# Patient Record
Sex: Female | Born: 1980 | Race: White | Hispanic: No | Marital: Married | Smoking: Former smoker
Health system: Southern US, Community
[De-identification: ages and names within clinical notes are randomized; demographics above are authoritative.]

## PROBLEM LIST (undated history)

## (undated) DIAGNOSIS — E669 Obesity, unspecified: Secondary | ICD-10-CM

## (undated) DIAGNOSIS — I493 Ventricular premature depolarization: Secondary | ICD-10-CM

## (undated) DIAGNOSIS — D219 Benign neoplasm of connective and other soft tissue, unspecified: Secondary | ICD-10-CM

## (undated) DIAGNOSIS — J45909 Unspecified asthma, uncomplicated: Secondary | ICD-10-CM

## (undated) DIAGNOSIS — K219 Gastro-esophageal reflux disease without esophagitis: Secondary | ICD-10-CM

## (undated) DIAGNOSIS — S161XXA Strain of muscle, fascia and tendon at neck level, initial encounter: Secondary | ICD-10-CM

## (undated) DIAGNOSIS — D649 Anemia, unspecified: Secondary | ICD-10-CM

## (undated) DIAGNOSIS — R000 Tachycardia, unspecified: Secondary | ICD-10-CM

## (undated) HISTORY — DX: Gastro-esophageal reflux disease without esophagitis: K21.9

## (undated) HISTORY — DX: Obesity, unspecified: E66.9

## (undated) HISTORY — DX: Ventricular premature depolarization: I49.3

## (undated) HISTORY — DX: Tachycardia, unspecified: R00.0

## (undated) HISTORY — DX: Unspecified asthma, uncomplicated: J45.909

## (undated) HISTORY — DX: Benign neoplasm of connective and other soft tissue, unspecified: D21.9

## (undated) HISTORY — DX: Strain of muscle, fascia and tendon at neck level, initial encounter: S16.1XXA

## (undated) HISTORY — PX: DILATION AND CURETTAGE, DIAGNOSTIC / THERAPEUTIC: SUR384

---

## 2001-12-23 ENCOUNTER — Emergency Department (HOSPITAL_COMMUNITY): Admission: EM | Admit: 2001-12-23 | Discharge: 2001-12-23 | Payer: Self-pay | Admitting: Emergency Medicine

## 2001-12-23 ENCOUNTER — Encounter: Payer: Self-pay | Admitting: Emergency Medicine

## 2001-12-29 ENCOUNTER — Emergency Department (HOSPITAL_COMMUNITY): Admission: EM | Admit: 2001-12-29 | Discharge: 2001-12-29 | Payer: Self-pay | Admitting: Emergency Medicine

## 2009-05-20 ENCOUNTER — Encounter: Payer: Self-pay | Admitting: Cardiology

## 2009-05-25 ENCOUNTER — Encounter: Payer: Self-pay | Admitting: Cardiology

## 2009-06-20 ENCOUNTER — Encounter: Payer: Self-pay | Admitting: Cardiology

## 2009-06-22 DIAGNOSIS — R002 Palpitations: Secondary | ICD-10-CM | POA: Insufficient documentation

## 2009-06-23 ENCOUNTER — Ambulatory Visit: Payer: Self-pay | Admitting: Cardiology

## 2009-06-23 DIAGNOSIS — R0602 Shortness of breath: Secondary | ICD-10-CM | POA: Insufficient documentation

## 2009-07-01 ENCOUNTER — Encounter: Payer: Self-pay | Admitting: Cardiology

## 2009-07-01 ENCOUNTER — Ambulatory Visit: Payer: Self-pay | Admitting: Cardiology

## 2009-07-04 ENCOUNTER — Encounter (INDEPENDENT_AMBULATORY_CARE_PROVIDER_SITE_OTHER): Payer: Self-pay | Admitting: *Deleted

## 2009-08-02 ENCOUNTER — Encounter: Payer: Self-pay | Admitting: Cardiology

## 2009-08-03 ENCOUNTER — Ambulatory Visit: Payer: Self-pay | Admitting: Cardiology

## 2010-07-11 NOTE — Procedures (Signed)
Summary: Holter and Event/ CARDIONET ACTIVITY REPORT  Holter and Event/ CARDIONET ACTIVITY REPORT   Imported By: Dorise Hiss 08/03/2009 17:01:43  _____________________________________________________________________  External Attachment:    Type:   Image     Comment:   External Document

## 2010-07-11 NOTE — Letter (Signed)
Summary: Engineer, materials at [facility]  [address]   [place], [place] [address]   Phone: [phone]  Fax: [phone]        July 04, 2009 MRN: [id number]    ANYLA ISRAELSON [address] [place], [place]  [address]    Dear Ms. Hockman,  Your test ordered by Selena Batten has been reviewed by your physician (or physician assistant) and was found to be normal or stable. Your physician (or physician assistant) felt no changes were needed at this time.  __X__ Echocardiogram  ____ Cardiac Stress Test  ____ Lab Work  ____ Peripheral vascular study of arms, legs or neck  ____ CT scan or X-ray  ____ Lung or Breathing test  ____ Other:   Thank you.   Cyril Loosen, RN, BSN    Duane Boston, M.D., F.A.C.C. Thressa Sheller, M.D., F.A.C.C. Oneal Grout, M.D., F.A.C.C. Cheree Ditto, M.D., F.A.C.C. Daiva Nakayama, M.D., F.A.C.C. Kenney Houseman, M.D., F.A.C.C. Jeanne Ivan, PA-C

## 2010-07-11 NOTE — Assessment & Plan Note (Signed)
Summary: 6 wk fu -srs   Visit Type:  Follow-up Primary Provider:  Catalina Lunger Pace/Tapper  CC:  palpitations.  History of Present Illness: The patient presents for followup of palpitations. She is now [redacted] weeks pregnant. She saw Dr. Jens Som recently for these. She had an event monitor which demonstrated occasional premature ventricular contractions, rare ventricular bigeminy and symptomatic sinus tachycardia. There were no sustained dysrhythmias. She did have an echocardiogram which demonstrated a structurally normal heart.  Since that visit she has had fewer symptoms. She has cut out caffeine. She will clearly feel the palpitations. They are short lived and not associated with syncope or presyncope. She's had no new chest discomfort, neck or arm discomfort. She has had no new shortness of breath.  Preventive Screening-Counseling & Management  Alcohol-Tobacco     Smoking Status: quit > 6 months  Comments: smoked as teenager quit about 12 yrs ago  Current Medications (verified): 1)  Pre-Natal Formula  Tabs (Prenatal Multivit-Min-Fe-Fa) .... Take 1 Tablet By Mouth Once A Day  Allergies: 1)  ! Pcn  Comments:  Nurse/Medical Assistant: The patient's medications were reviewed with the patient and were updated in the Medication List. Pt verbally confirmed medications.  Cyril Loosen, RN, BSN (August 03, 2009 11:17 AM)  Past History:  Past Medical History: None  Social History: Smoking Status:  quit > 6 months  Review of Systems       As stated in the HPI and negative for all other systems.   Vital Signs:  Patient profile:   30 year old female Height:      68 inches Weight:      218.75 pounds BMI:     33.38 Pulse rate:   79 / minute BP sitting:   103 / 71  (left arm) Cuff size:   large  Vitals Entered By: Cyril Loosen, RN, BSN (August 03, 2009 11:15 AM)  Nutrition Counseling: Patient's BMI is greater than 25 and therefore counseled on weight management options. CC:  palpitations Comments follow up visit.   Physical Exam  General:  Well developed, well nourished, in no acute distress. Eyes:  PERRLA/EOM intact; conjunctiva and lids normal. Neck:  Neck supple, no JVD. No masses, thyromegaly or abnormal cervical nodes. Chest Wall:  no deformities or breast masses noted Lungs:  Clear bilaterally to auscultation and percussion. Heart:  Non-displaced PMI, chest non-tender; regular rate and rhythm, S1, S2 without murmurs, rubs or gallops. Carotid upstroke normal, no bruit. Normal abdominal aortic size, no bruits. Femorals normal pulses, no bruits. Pedals normal pulses. No edema, no varicosities. Abdomen:  Bowel sounds positive; abdomen soft and non-tender without masses, organomegaly, or hernias noted. No hepatosplenomegaly. Msk:  Back normal, normal gait. Muscle strength and tone normal. Pulses:  pulses normal in all 4 extremities Extremities:  No clubbing or cyanosis. Neurologic:  Alert and oriented x 3. Psych:  Normal affect.   Impression & Recommendations:  Problem # 1:  PALPITATIONS (ICD-785.1) The patient is having fewer palpitations. She's had documented PVCs. At this point these are relatively asymptomatic and after careful discussion with the patient it is agreed that we will not add any medical therapy at this point. However, should these become more symptomatic she will let us know at which point we could consider beta blockers.  Problem # 2:  DYSPNEA (ICD-786.05) She has some mild dyspnea probably related to weight without overt cardiac etiology is identified. This is very mild. No further workup is suggested at this point but we would  reevaluate if this becomes worse.  Patient Instructions: 1)  Your physician recommends that you continue on your current medications as directed. Please refer to the Current Medication list given to you today. 2)  No follow up needed.

## 2010-07-11 NOTE — Letter (Signed)
[facility]: [facility]   Imported By: Zachary George 06/22/2009 15:29:01  _____________________________________________________________________  External Attachment:    Type:   Image     Comment:   External Document

## 2010-07-11 NOTE — Assessment & Plan Note (Signed)
Summary: ***APPT 12:45*** NP- [redacted] WK PREGNANT   Visit Type:  abnorml HR  Primary Provider:  Catalina Lunger Pace/Tapper  CC:  abnormal HR.  History of Present Illness: 30 year old female for evaluation of palpitations. Patient is [redacted] weeks pregnant. The patient has a long history of palpitations described as a skipped. Over the past 8 weeks these have worsened. She feels this is most likely related to pregnancy. They are described as an irregular heartbeat where there is a skip every third beat. She also states that she felt her heart "pounding" but she checked her heart rate and it was in the 70s. She has not had sustained palpitations. She does describe new dyspnea on exertion but there is no orthopnea or PND. There is mild pedal edema which is typical of her previous pregnancies. She has not had exertional chest pain. There's been no syncope. Because of her palpitations cardiology was asked to further evaluate.  Preventive Screening-Counseling & Management  Alcohol-Tobacco     Smoking Status: never     Year Quit: 1999  Current Medications (verified): 1)  Pre-Natal Formula  Tabs (Prenatal Multivit-Min-Fe-Fa) .... Take 1 Tablet By Mouth Once A Day 2)  Zithromax Z-Pak 250 Mg Tabs (Azithromycin) .... Use As Directed  Allergies (verified): 1)  ! Pcn  Comments:  Nurse/Medical Assistant: The patient's medications and allergies were reviewed with the patient and were updated in the Medication and Allergy Lists. Verbally gave names.  Past History:  Past Medical History: no significant past medical history  Past Surgical History: Previous D and C  Family History: Reviewed history from 06/22/2009 and no changes required. Mother with atrial fibrillation Father with DM  Social History: Reviewed history and no changes required. Full Time Married  Tobacco Use - No.  Alcohol Use - no Smoking Status:  never  Review of Systems       Recent bronchitis/sinusitis but no fevers or chills,   hemoptysis, dysphasia, odynophagia, melena, hematochezia, dysuria, hematuria, rash, seizure activity, orthopnea, PND,  claudication. Remaining systems are negative.   Vital Signs:  Patient profile:   30 year old female Height:      68 inches Weight:      214 pounds BMI:     32.66 Pulse rate:   83 / minute BP sitting:   113 / 70  (left arm) Cuff size:   large  Vitals Entered By: Carlye Grippe (June 23, 2009 12:49 PM)  Nutrition Counseling: Patient's BMI is greater than 25 and therefore counseled on weight management options. CC: abnormal HR   Physical Exam  General:  Well developed/well nourished in NAD Skin warm/dry Patient not depressed No peripheral clubbing Back-normal HEENT-normal/normal eyelids Neck supple/normal carotid upstroke bilaterally; no bruits; no JVD; no thyromegaly chest - CTA/ normal expansion CV - RRR/normal S1 and S2; 2/6 systolic ejection murmur, but no rubs or gallops.  PMI nondisplaced Abdomen -NT/ND, no HSM, 9 week intrauterine pregnancy + bowel sounds, no bruit 2+ femoral pulses, no bruits Ext-trace edema, chords, 2+ DP, varicosities noted Neuro-grossly nonfocal     EKG  Procedure date:  06/23/2009  Findings:      Sinus rhythm at a rate of 76. Axis normal. No significant ST changes.  Impression & Recommendations:  Problem # 1:  PALPITATIONS (ICD-785.1) These are most likely secondary to PACs or PVCs. However she is very concerned about these. I will schedule a CardioNet monitor for more full evaluation. If these are indeed premature beats I would like to avoid beta-blockade if possible  given that she is pregnant. We will reserve this for  significant symptoms. Orders: EKG w/ Interpretation (93000) Cardionet/Event Monitor (Cardionet/Event) 2-D Echocardiogram (2D Echo) 2-D Echocardiogram (2D Echo) Cardionet/Event Monitor (Cardionet/Event)  Problem # 2:  DYSPNEA (ICD-786.05) Check echocardiogram for LV function.  Problem # 3:   INTRAUTERINE PREGNANCY (ICD-V22.2) Management per OB.  Patient Instructions: 1)  Your physician has requested that you have an echocardiogram.  Echocardiography is a painless test that uses sound waves to create images of your heart. It provides your doctor with information about the size and shape of your heart and how well your heart's chambers and valves are working.  This procedure takes approximately one hour. There are no restrictions for this procedure. 2)  Your physician has recommended that you wear an event monitor.  Event monitors are medical devices that record the heart's electrical activity. Doctors most often use these monitors to diagnose arrhythmias. Arrhythmias are problems with the speed or rhythm of the heartbeat. The monitor is a small, portable device. You can wear one while you do your normal daily activities. This is usually used to diagnose what is causing palpitations/syncope (passing out). 3)  Your physician recommends that you schedule a follow-up appointment in: 6 weeks.

## 2010-12-14 ENCOUNTER — Encounter: Payer: Self-pay | Admitting: Cardiology

## 2012-06-11 HISTORY — PX: TRANSTHORACIC ECHOCARDIOGRAM: SHX275

## 2012-06-24 ENCOUNTER — Ambulatory Visit (HOSPITAL_COMMUNITY)
Admission: RE | Admit: 2012-06-24 | Discharge: 2012-06-24 | Disposition: A | Discharge status: Home / Self Care | Payer: BC Managed Care – PPO | Source: Ambulatory Visit | Attending: Cardiovascular Disease | Admitting: Cardiovascular Disease

## 2012-06-24 DIAGNOSIS — R0602 Shortness of breath: Secondary | ICD-10-CM | POA: Insufficient documentation

## 2012-06-24 NOTE — Progress Notes (Signed)
*  PRELIMINARY RESULTS* Echocardiogram 2D Echocardiogram has been performed.  Deborah Spence 06/24/2012, 9:22 AM

## 2013-01-16 ENCOUNTER — Telehealth: Payer: Self-pay | Admitting: *Deleted

## 2013-01-16 ENCOUNTER — Other Ambulatory Visit: Payer: Self-pay | Admitting: Cardiovascular Disease

## 2013-01-16 DIAGNOSIS — D649 Anemia, unspecified: Secondary | ICD-10-CM

## 2013-01-16 NOTE — Telephone Encounter (Signed)
Pt. Seen in [place] today , GI referral entered with Dr. Darrick Penna in [place]

## 2013-01-17 LAB — CBC WITH DIFFERENTIAL/PLATELET
Basophils Absolute: 0 10*3/uL (ref 0.0–0.1)
Basophils Relative: 0 % (ref 0–1)
Eosinophils Absolute: 0.2 10*3/uL (ref 0.0–0.7)
Eosinophils Relative: 3 % (ref 0–5)
HCT: 41 % (ref 36.0–46.0)
Hemoglobin: 13.8 g/dL (ref 12.0–15.0)
Lymphocytes Relative: 25 % (ref 12–46)
Lymphs Abs: 2.2 10*3/uL (ref 0.7–4.0)
MCH: 29.2 pg (ref 26.0–34.0)
MCHC: 33.7 g/dL (ref 30.0–36.0)
MCV: 86.7 fL (ref 78.0–100.0)
Monocytes Absolute: 0.6 10*3/uL (ref 0.1–1.0)
Monocytes Relative: 6 % (ref 3–12)
Neutro Abs: 5.9 10*3/uL (ref 1.7–7.7)
Neutrophils Relative %: 66 % (ref 43–77)
Platelets: 338 10*3/uL (ref 150–400)
RBC: 4.73 MIL/uL (ref 3.87–5.11)
RDW: 14 % (ref 11.5–15.5)
WBC: 8.9 10*3/uL (ref 4.0–10.5)

## 2013-01-17 LAB — IRON AND TIBC
%SAT: 22 % (ref 20–55)
Iron: 73 ug/dL (ref 42–145)
TIBC: 339 ug/dL (ref 250–470)
UIBC: 266 ug/dL (ref 125–400)

## 2013-01-17 LAB — COMPREHENSIVE METABOLIC PANEL
Alkaline Phosphatase: 67 U/L (ref 39–117)
CO2: 29 mEq/L (ref 19–32)
Creat: 0.73 mg/dL (ref 0.50–1.10)
Glucose, Bld: 78 mg/dL (ref 70–99)
Total Bilirubin: 0.4 mg/dL (ref 0.3–1.2)

## 2013-01-17 LAB — RETICULOCYTES
ABS Retic: 85.1 10*3/uL (ref 19.0–186.0)
Retic Ct Pct: 1.8 % (ref 0.4–2.3)

## 2013-01-17 LAB — FERRITIN: Ferritin: 6 ng/mL — ABNORMAL LOW (ref 10–291)

## 2013-01-17 LAB — FOLATE: Folate: >20 ng/mL

## 2013-01-21 ENCOUNTER — Encounter: Payer: Self-pay | Admitting: Cardiovascular Disease

## 2013-02-27 ENCOUNTER — Encounter: Payer: Self-pay | Admitting: Gastroenterology

## 2013-03-23 ENCOUNTER — Ambulatory Visit: Payer: BC Managed Care – PPO | Admitting: Gastroenterology

## 2013-07-06 ENCOUNTER — Ambulatory Visit: Payer: BC Managed Care – PPO | Admitting: Gastroenterology

## 2013-11-27 ENCOUNTER — Encounter: Payer: Self-pay | Admitting: *Deleted

## 2013-12-08 ENCOUNTER — Ambulatory Visit: Payer: BC Managed Care – PPO | Admitting: Internal Medicine

## 2013-12-22 ENCOUNTER — Encounter (HOSPITAL_COMMUNITY): Payer: Self-pay | Admitting: Emergency Medicine

## 2013-12-22 ENCOUNTER — Emergency Department (HOSPITAL_COMMUNITY)
Admission: EM | Admit: 2013-12-22 | Discharge: 2013-12-22 | Disposition: A | Discharge status: Home / Self Care | Payer: BC Managed Care – PPO | Attending: Emergency Medicine | Admitting: Emergency Medicine

## 2013-12-22 DIAGNOSIS — Z880 Allergy status to penicillin: Secondary | ICD-10-CM | POA: Insufficient documentation

## 2013-12-22 DIAGNOSIS — Z79899 Other long term (current) drug therapy: Secondary | ICD-10-CM | POA: Insufficient documentation

## 2013-12-22 DIAGNOSIS — Z87891 Personal history of nicotine dependence: Secondary | ICD-10-CM | POA: Insufficient documentation

## 2013-12-22 DIAGNOSIS — M79605 Pain in left leg: Secondary | ICD-10-CM

## 2013-12-22 DIAGNOSIS — M79609 Pain in unspecified limb: Secondary | ICD-10-CM | POA: Insufficient documentation

## 2013-12-22 NOTE — Discharge Instructions (Signed)
Please return to the emergency department tomorrow to have an ultrasound of your left leg to rule out a blood clot. If you have worsening pain, chest pain or shortness of breath before your appointment, please return to the emergency department.   Possible Deep Vein Thrombosis A deep vein thrombosis (DVT) is a blood clot that develops in the deep, larger veins of the leg, arm, or pelvis. These are more dangerous than clots that might form in veins near the surface of the body. A DVT can lead to complications if the clot breaks off and travels in the bloodstream to the lungs.  A DVT can damage the valves in your leg veins, so that instead of flowing upward, the blood pools in the lower leg. This is called post-thrombotic syndrome, and it can result in pain, swelling, discoloration, and sores on the leg. CAUSES Usually, several things contribute to blood clots forming. Contributing factors include:  The flow of blood slows down.  The inside of the vein is damaged in some way.  You have a condition that makes blood clot more easily. RISK FACTORS Some people are more likely than others to develop blood clots. Risk factors include:   Older age, especially over 1 years of age.  Having a family history of blood clots or if you have already had a blot clot.  Having major or lengthy surgery. This is especially true for surgery on the hip, knee, or belly (abdomen). Hip surgery is particularly high risk.  Breaking a hip or leg.  Sitting or lying still for a long time. This includes long-distance travel, paralysis, or recovery from an illness or surgery.  Having cancer or cancer treatment.  Having a long, thin tube (catheter) placed inside a vein during a medical procedure.  Being overweight (obese).  Pregnancy and childbirth.  Hormone changes make the blood clot more easily during pregnancy.  The fetus puts pressure on the veins of the pelvis.  There is a risk of injury to veins during  delivery or a caesarean. The risk is highest just after childbirth.  Medicines with the female hormone estrogen. This includes birth control pills and hormone replacement therapy.  Smoking.  Other circulation or heart problems.  SIGNS AND SYMPTOMS When a clot forms, it can either partially or totally block the blood flow in that vein. Symptoms of a DVT can include:  Swelling of the leg or arm, especially if one side is much worse.  Warmth and redness of the leg or arm, especially if one side is much worse.  Pain in an arm or leg. If the clot is in the leg, symptoms may be more noticeable or worse when standing or walking. The symptoms of a DVT that has traveled to the lungs (pulmonary embolism, PE) usually start suddenly and include:  Shortness of breath.  Coughing.  Coughing up blood or blood-tinged phlegm.  Chest pain. The chest pain is often worse with deep breaths.  Rapid heartbeat. Anyone with these symptoms should get emergency medical treatment right away. Call your local emergency services (911 in the [place] if you have these symptoms. DIAGNOSIS If a DVT is suspected, your health care provider will take a full medical history and perform a physical exam. Tests that also may be required include:  Blood tests, including studies of the clotting properties of the blood.  Ultrasonography to see if you have clots in your legs or lungs.  X-rays to show the flow of blood when dye is injected into  the veins (venography).  Studies of your lungs if you have any chest symptoms. PREVENTION  Exercise the legs regularly. Take a brisk 30-minute walk every day.  Maintain a weight that is appropriate for your height.  Avoid sitting or lying in bed for long periods of time without moving your legs.  Women, particularly those over the age of 40 years, should consider the risks and benefits of taking estrogen medicines, including birth control pills.  Do not smoke, especially if you  take estrogen medicines.  Long-distance travel can increase your risk of DVT. You should exercise your legs by walking or pumping the muscles every hour.  In-hospital prevention:  Many of the risk factors above relate to situations that exist with hospitalization, either for illness, injury, or elective surgery.  Your health care provider will assess you for the need for venous thromboembolism prophylaxis when you are admitted to the hospital. If you are having surgery, your surgeon will assess you the day of or day after surgery.  Prevention may include medical and nonmedical measures. TREATMENT Once identified, a DVT can be treated. It can also be prevented in some circumstances. Once you have had a DVT, you may be at increased risk for a DVT in the future. The most common treatment for DVT is blood thinning (anticoagulant) medicine, which reduces the blood's tendency to clot. Anticoagulants can stop new blood clots from forming and stop old ones from growing. They cannot dissolve existing clots. Your body does this by itself over time. Anticoagulants can be given by mouth, by IV access, or by injection. Your health care provider will determine the best program for you. Other medicines or treatments that may be used are:  Heparin or related medicines (low molecular weight heparin) are usually the first treatment for a blood clot. They act quickly. However, they cannot be taken orally.  Heparin can cause a fall in a component of blood that stops bleeding and forms blood clots (platelets). You will be monitored with blood tests to be sure this does not occur.  Warfarin is an anticoagulant that can be swallowed. It takes a few days to start working, so usually heparin or related medicines are used in combination. Once warfarin is working, heparin is usually stopped.  Less commonly, clot dissolving drugs (thrombolytics) are used to dissolve a DVT. They carry a high risk of bleeding, so they are  used mainly in severe cases, where your life or a limb is threatened.  Very rarely, a blood clot in the leg needs to be removed surgically.  If you are unable to take anticoagulants, your health care provider may arrange for you to have a filter placed in a main vein in your abdomen. This filter prevents clots from traveling to your lungs. HOME CARE INSTRUCTIONS  Take all medicines prescribed by your health care provider. Only take over-the-counter or prescription medicines for pain, fever, or discomfort as directed by your health care provider.  Warfarin. Most people will continue taking warfarin after hospital discharge. Your health care provider will advise you on the length of treatment (usually 3-6 months, sometimes lifelong).  Too much and too little warfarin are both dangerous. Too much warfarin increases the risk of bleeding. Too little warfarin continues to allow the risk for blood clots. While taking warfarin, you will need to have regular blood tests to measure your blood clotting time. These blood tests usually include both the prothrombin time (PT) and international normalized ratio (INR) tests. The PT and INR  results allow your health care provider to adjust your dose of warfarin. The dose can change for many reasons. It is critically important that you take warfarin exactly as prescribed, and that you have your PT and INR levels drawn exactly as directed.  Many foods, especially foods high in vitamin K, can interfere with warfarin and affect the PT and INR results. Foods high in vitamin K include spinach, kale, broccoli, cabbage, collard and turnip greens, brussel sprouts, peas, cauliflower, seaweed, and parsley as well as beef and pork liver, green tea, and soybean oil. You should eat a consistent amount of foods high in vitamin K. Avoid major changes in your diet, or notify your health care provider before changing your diet. Arrange a visit with a dietitian to answer your  questions.  Many medicines can interfere with warfarin and affect the PT and INR results. You must tell your health care provider about any and all medicines you take. This includes all vitamins and supplements. Be especially cautious with aspirin and anti-inflammatory medicines. Ask your health care provider before taking these. Do not take or discontinue any prescribed or over-the-counter medicine except on the advice of your health care provider or pharmacist.  Warfarin can have side effects, primarily excessive bruising or bleeding. You will need to hold pressure over cuts for longer than usual. Your health care provider or pharmacist will discuss other potential side effects.  Alcohol can change the body's ability to handle warfarin. It is best to avoid alcoholic drinks or consume only very small amounts while taking warfarin. Notify your health care provider if you change your alcohol intake.  Notify your dentist or other health care providers before procedures.  Activity. Ask your health care provider how soon you can go back to normal activities. It is important to stay active to prevent blood clots. If you are on anticoagulant medicine, avoid contact sports.  Exercise. It is very important to exercise. This is especially important while traveling, sitting, or standing for long periods of time. Exercise your legs by walking or by pumping the muscles frequently. Take frequent walks.  Compression stockings. These are tight elastic stockings that apply pressure to the lower legs. This pressure can help keep the blood in the legs from clotting. You may need to wear compression stockings at home to help prevent a DVT.  Do not smoke. If you smoke, quit. Ask your health care provider for help with quitting smoking.  Learn as much as you can about DVT. Knowing more about the condition should help you keep it from coming back.  Wear a medical alert bracelet or carry a medical alert card. SEEK  MEDICAL CARE IF:  You notice a rapid heartbeat.  You feel weaker or more tired than usual.  You feel faint.  You notice increased bruising.  You feel your symptoms are not getting better in the time expected.  You believe you are having side effects of medicine. SEEK IMMEDIATE MEDICAL CARE IF:  You have chest pain.  You have trouble breathing.  You have new or increased swelling or pain in one leg.  You cough up blood.  You notice blood in vomit, in a bowel movement, or in urine. MAKE SURE YOU:  Understand these instructions.  Will watch your condition.  Will get help right away if you are not doing well or get worse. Document Released: 05/28/2005 Document Revised: 03/18/2013 Document Reviewed: 02/02/2013 [facility] Patient Information 2015 [place], [place]. This information is not intended to replace advice  given to you by your health care provider. Make sure you discuss any questions you have with your health care provider.

## 2013-12-22 NOTE — ED Notes (Signed)
Dr. Ward at bedside.

## 2013-12-22 NOTE — ED Provider Notes (Signed)
TIME SEEN: 6:22 PM  CHIEF COMPLAINT: Left lower extremity pain and swelling  HPI: Patient is a 33 year old female with no significant past medical history who presents emergency department with pain, bruising and swelling in her left lower extremity behind her knee. She states she noticed this yesterday when she was sitting in a rocking chair. Denies any known injury. She states that she noticed a bruise to this area this morning and increased swelling in this leg. She was worried that she could have a DVT. No prior history of PE or DVT. No chest pain or shortness of breath. No fever. No joint swelling. She is having intermittent tingling sensation in her leg but no numbness or focal weakness. No bowel or bladder incontinence. No back pain.  ROS: See HPI Constitutional: no fever  Eyes: no drainage  ENT: no runny nose   Cardiovascular:  no chest pain  Resp: no SOB  GI: no vomiting GU: no dysuria Integumentary: no rash  Allergy: no hives  Musculoskeletal: no leg swelling  Neurological: no slurred speech ROS otherwise negative  PAST MEDICAL HISTORY/PAST SURGICAL HISTORY:  Past Medical History  Diagnosis Date  . Intermittent palpitations     MEDICATIONS:  Prior to Admission medications   Medication Sig Start Date End Date Taking? Authorizing Provider  Acetaminophen (TYLENOL PO) Take by mouth as needed.    Historical Provider, MD  ferrous sulfate 325 (65 FE) MG tablet Take 325 mg by mouth daily with breakfast.    Historical Provider, MD  Prenatal Multivit-Min-Fe-FA (PRE-NATAL FORMULA) TABS Take 1 tablet by mouth daily.      Historical Provider, MD  ranitidine (ZANTAC) 150 MG capsule Take 150 mg by mouth 2 (two) times daily.    Historical Provider, MD  vitamin C (ASCORBIC ACID) 500 MG tablet Take 500 mg by mouth daily.    Historical Provider, MD    ALLERGIES:  Allergies  Allergen Reactions  . Penicillins     SOCIAL HISTORY:  History  Substance Use Topics  . Smoking status:  Former Smoker -- 0.50 packs/day for 5 years    Types: Cigarettes    Quit date: 11/27/1997  . Smokeless tobacco: Not on file     Comment: Tobacco use-no  . Alcohol Use: No    FAMILY HISTORY: Family History  Problem Relation Age of Onset  . Atrial fibrillation Mother     SSS, AF ablation  . Diabetes Father   . Lung cancer Maternal Grandfather   . Cancer Paternal Grandmother     EXAM: BP 137/51  Pulse 77  Temp(Src) 97.5 F (36.4 C) (Oral)  Resp 18  Ht 5\' 8"  (1.727 m)  Wt 280 lb (127.007 kg)  BMI 42.58 kg/m2  SpO2 100%  LMP 12/19/2013 CONSTITUTIONAL: Alert and oriented and responds appropriately to questions. Well-appearing; well-nourished, pleasant, smiling, in no apparent distress HEAD: Normocephalic EYES: Conjunctivae clear, PERRL ENT: normal nose; no rhinorrhea; moist mucous membranes; pharynx without lesions noted NECK: Supple, no meningismus, no LAD  CARD: RRR; S1 and S2 appreciated; no murmurs, no clicks, no rubs, no gallops RESP: Normal chest excursion without splinting or tachypnea; breath sounds clear and equal bilaterally; no wheezes, no rhonchi, no rales,  ABD/GI: Normal bowel sounds; non-distended; soft, non-tender, no rebound, no guarding BACK:  The back appears normal and is non-tender to palpation, there is no CVA tenderness EXT: Tender to palpation in the posterior left knee with a bruise to the proximal posterior lateral calf, compartments are soft, small amount of increased  swelling in the left leg is apparent compared to the right, 2+ DP pulses bilaterally, sensation to light touch intact diffusely, no joint effusion, no bony tenderness or deformity, Normal ROM in all joints; otherwise extremities are non-tender to palpation; no edema; normal capillary refill; no cyanosis    SKIN: Normal color for age and race; warm NEURO: Moves all extremities equally, sensation to light touch intact diffusely PSYCH: The patient's mood and manner are appropriate. Grooming and  personal hygiene are appropriate.  MEDICAL DECISION MAKING: Patient here with likely ruptured varicose vein, superficial thrombophlebitis but DVT cannot be excluded. Given she has a large bruise to this area for her d-dimer will likely be positive. I feel she will need an ultrasound of her lower extremity that we are unable to obtain this at this time given we have no ultrasound technician. Given this not an emergent condition, have offered patient's Lovenox with followup tomorrow for an ultrasound. She refuses anticoagulation at this time stating she has heavy menstrual periods and has been anemic and it makes her very concerned to take anticoagulation. She understands that there are risks including pulmonary embolus which can be life-threatening without treatment if this is a possible DVT. She agrees to come back tomorrow morning for a venous Doppler of her lower extremity. There is no sign of cellulitis, septic arthritis and no history of any trauma to suggest bony injury. I do feel she is safe to be discharged home. She denies wanting any pain medication at this time. We'll have her alternate Tylenol and Motrin, rest, elevate and apply ice to this area. Have discussed strict return precautions. Patient verbalizes understanding and is comfortable with plan.      [place], DO 12/22/13 [phone]

## 2013-12-22 NOTE — ED Notes (Signed)
Pt states a vein burst in the back of her left leg last night. States her leg is pai ful and swollen

## 2013-12-23 ENCOUNTER — Ambulatory Visit (HOSPITAL_COMMUNITY)
Admission: RE | Admit: 2013-12-23 | Discharge: 2013-12-23 | Disposition: A | Discharge status: Home / Self Care | Payer: BC Managed Care – PPO | Source: Ambulatory Visit | Attending: Emergency Medicine | Admitting: Emergency Medicine

## 2013-12-23 DIAGNOSIS — M7989 Other specified soft tissue disorders: Secondary | ICD-10-CM | POA: Insufficient documentation

## 2013-12-23 DIAGNOSIS — R002 Palpitations: Secondary | ICD-10-CM | POA: Insufficient documentation

## 2013-12-30 ENCOUNTER — Ambulatory Visit (HOSPITAL_COMMUNITY): Payer: BC Managed Care – PPO

## 2014-01-09 DIAGNOSIS — S161XXA Strain of muscle, fascia and tendon at neck level, initial encounter: Secondary | ICD-10-CM

## 2014-01-09 HISTORY — DX: Strain of muscle, fascia and tendon at neck level, initial encounter: S16.1XXA

## 2014-01-25 ENCOUNTER — Emergency Department (HOSPITAL_COMMUNITY)
Admission: EM | Admit: 2014-01-25 | Discharge: 2014-01-25 | Disposition: A | Discharge status: Home / Self Care | Payer: BC Managed Care – PPO | Attending: Emergency Medicine | Admitting: Emergency Medicine

## 2014-01-25 ENCOUNTER — Encounter (HOSPITAL_COMMUNITY): Payer: Self-pay | Admitting: Emergency Medicine

## 2014-01-25 ENCOUNTER — Emergency Department (HOSPITAL_COMMUNITY): Payer: BC Managed Care – PPO

## 2014-01-25 DIAGNOSIS — S161XXA Strain of muscle, fascia and tendon at neck level, initial encounter: Secondary | ICD-10-CM

## 2014-01-25 DIAGNOSIS — S4980XA Other specified injuries of shoulder and upper arm, unspecified arm, initial encounter: Secondary | ICD-10-CM | POA: Insufficient documentation

## 2014-01-25 DIAGNOSIS — S46909A Unspecified injury of unspecified muscle, fascia and tendon at shoulder and upper arm level, unspecified arm, initial encounter: Secondary | ICD-10-CM | POA: Diagnosis not present

## 2014-01-25 DIAGNOSIS — Z880 Allergy status to penicillin: Secondary | ICD-10-CM | POA: Diagnosis not present

## 2014-01-25 DIAGNOSIS — Z79899 Other long term (current) drug therapy: Secondary | ICD-10-CM | POA: Insufficient documentation

## 2014-01-25 DIAGNOSIS — D649 Anemia, unspecified: Secondary | ICD-10-CM | POA: Insufficient documentation

## 2014-01-25 DIAGNOSIS — S139XXA Sprain of joints and ligaments of unspecified parts of neck, initial encounter: Secondary | ICD-10-CM | POA: Insufficient documentation

## 2014-01-25 DIAGNOSIS — S0993XA Unspecified injury of face, initial encounter: Secondary | ICD-10-CM | POA: Diagnosis present

## 2014-01-25 DIAGNOSIS — S199XXA Unspecified injury of neck, initial encounter: Secondary | ICD-10-CM

## 2014-01-25 DIAGNOSIS — Y9389 Activity, other specified: Secondary | ICD-10-CM | POA: Diagnosis not present

## 2014-01-25 DIAGNOSIS — Y92410 Unspecified street and highway as the place of occurrence of the external cause: Secondary | ICD-10-CM | POA: Diagnosis not present

## 2014-01-25 DIAGNOSIS — Z87891 Personal history of nicotine dependence: Secondary | ICD-10-CM | POA: Insufficient documentation

## 2014-01-25 HISTORY — DX: Anemia, unspecified: D64.9

## 2014-01-25 MED ORDER — METAXALONE 800 MG PO TABS: 800.0000 mg | ORAL_TABLET | Freq: Three times a day (TID) | ORAL | Status: DC
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
## 2014-01-25 NOTE — Discharge Instructions (Signed)
Cervical Sprain A cervical sprain is when the tissues (ligaments) that hold the neck bones in place stretch or tear. HOME CARE   Put ice on the injured area.  Put ice in a plastic bag.  Place a towel between your skin and the bag.  Leave the ice on for 15-20 minutes, 3-4 times a day.  Only take medicine as told by your doctor.  Adjust your work station so that you have good posture while you work.  Avoid positions and activities that make your problems worse.  Warm up and stretch before being active.  Apply a heating pad to your neck and shoulder area for 20 minutes 3 times daily starting on day 3.  This will help relax muscle spasm. GET HELP IF:  Your pain is not controlled with medicine.  You cannot take less pain medicine over time as planned.  Your activity level does not improve as expected. GET HELP RIGHT AWAY IF:   You are bleeding.  Your stomach is upset.  You have an allergic reaction to your medicine.  You develop new problems that you cannot explain.  You lose feeling (become numb) or you cannot move any part of your body (paralysis).  You have tingling or weakness in any part of your body.  Your symptoms get worse. Symptoms include:  Pain, soreness, stiffness, puffiness (swelling), or a burning feeling in your neck.  Pain when your neck is touched.  Shoulder or upper back pain.  Limited ability to move your neck.  Headache.  Dizziness.  Your hands or arms feel week, lose feeling, or tingle.  Muscle spasms.  Difficulty swallowing or chewing. MAKE SURE YOU:   Understand these instructions.  Will watch your condition.  Will get help right away if you are not doing well or get worse. Document Released: 11/14/2007 Document Revised: 01/28/2013 Document Reviewed: 12/03/2012 [facility] Patient Information 2015 [place], [place]. This information is not intended to replace advice given to you by your health care provider. Make sure you discuss any  questions you have with your health care provider.
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
## 2014-01-26 NOTE — ED Provider Notes (Signed)
Medical screening examination/treatment/procedure(s) were performed by non-physician practitioner and as supervising physician I was immediately available for consultation/collaboration.   EKG Interpretation None        Delice Bison Aquarius Latouche, DO 01/26/14 1636

## 2014-02-22 ENCOUNTER — Ambulatory Visit (INDEPENDENT_AMBULATORY_CARE_PROVIDER_SITE_OTHER): Payer: BC Managed Care – PPO | Admitting: Internal Medicine

## 2014-02-22 ENCOUNTER — Encounter: Payer: Self-pay | Admitting: Internal Medicine

## 2014-02-22 VITALS — BP 126/78 | HR 62 | Ht 64.0 in | Wt 232.0 lb

## 2014-02-22 DIAGNOSIS — I493 Ventricular premature depolarization: Secondary | ICD-10-CM | POA: Insufficient documentation

## 2014-02-22 DIAGNOSIS — R002 Palpitations: Secondary | ICD-10-CM

## 2014-02-22 DIAGNOSIS — R0602 Shortness of breath: Secondary | ICD-10-CM

## 2014-02-22 DIAGNOSIS — I4949 Other premature depolarization: Secondary | ICD-10-CM
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
## 2014-02-22 NOTE — Patient Instructions (Signed)
Your physician wants you to follow-up in: 1 year with Dr. Rayann Heman. You will receive a reminder letter in the mail two months in advance. If you don't receive a letter, please call our office to schedule the follow-up appointment.  Your physician recommends that you continue on your current medications as directed. Please refer to the Current Medication list given to you today.  Your physician has requested that you have an exercise tolerance test. For further information please visit [email or web address]. Please also follow instruction sheet, as given.

## 2014-04-09 ENCOUNTER — Encounter: Payer: BC Managed Care – PPO | Admitting: Physician Assistant

## 2014-04-09 ENCOUNTER — Ambulatory Visit: Payer: BC Managed Care – PPO | Admitting: Physician Assistant

## 2014-05-14 ENCOUNTER — Encounter: Payer: Self-pay | Admitting: Internal Medicine

## 2014-05-20 ENCOUNTER — Telehealth: Payer: Self-pay | Admitting: General Practice

## 2014-05-20 NOTE — Telephone Encounter (Signed)
New message       Patient is calling to schedule a nuclear stress test on tomorrow 05/21/14  Please advice.

## 2014-05-20 NOTE — Telephone Encounter (Signed)
This patient already has an appt for tomorrow, Deborah Spence called to confirm, is she just confirming the appt or does she need to reschedule? The schedulers can reschedule this if that is what she needs.

## 2014-05-21 ENCOUNTER — Ambulatory Visit (INDEPENDENT_AMBULATORY_CARE_PROVIDER_SITE_OTHER): Payer: BC Managed Care – PPO | Admitting: Nurse Practitioner

## 2014-05-21 DIAGNOSIS — R002 Palpitations: Secondary | ICD-10-CM

## 2014-05-21 DIAGNOSIS — R0602 Shortness of breath: Secondary | ICD-10-CM

## 2014-05-21 NOTE — Progress Notes (Signed)
Exercise Treadmill Test  Pre-Exercise Testing Evaluation Rhythm: normal sinus  Rate: 81 bpm     Test  Exercise Tolerance Test Ordering MD: Thompson Grayer, MD  Interpreting MD: Ignacia Bayley, NP  Unique Test No: 1  Treadmill:  1  Indication for ETT: exertional dyspnea  Contraindication to ETT: No   Stress Modality: exercise - treadmill  Cardiac Imaging Performed: non   Protocol: standard Bruce - maximal  Max BP:  177/65  Max MPHR (bpm):  185 85% MPR (bpm):  157  MPHR obtained (bpm):  171 % MPHR obtained:  92  Reached 85% MPHR (min:sec):  4:40 Total Exercise Time (min-sec):  9:00  Workload in METS:  10.2 Borg Scale: 13  Reason ETT Terminated:  fatigue    ST Segment Analysis At Rest: normal ST segments - no evidence of significant ST depression With Exercise: no evidence of significant ST depression  Other Information Arrhythmia:  pac's Angina during ETT:  absent (0) Quality of ETT:  diagnostic  ETT Interpretation:  normal - no evidence of ischemia by ST analysis  Comments: No chest pain, palpitations, or pvc's.  No acute st/t changes.  Recommendations: No further ischemic w/u required.  F/U with Dr. Rayann Heman prn.

## 2015-04-12 ENCOUNTER — Encounter: Payer: Self-pay | Admitting: Physician Assistant

## 2015-04-12 DIAGNOSIS — E669 Obesity, unspecified: Secondary | ICD-10-CM | POA: Insufficient documentation
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
## 2015-04-13 ENCOUNTER — Encounter: Payer: Self-pay | Admitting: Physician Assistant

## 2015-04-13 ENCOUNTER — Ambulatory Visit (INDEPENDENT_AMBULATORY_CARE_PROVIDER_SITE_OTHER): Payer: BC Managed Care – PPO | Admitting: Physician Assistant

## 2015-04-13 VITALS — BP 110/60 | HR 73 | Ht 64.0 in | Wt 238.0 lb

## 2015-04-13 DIAGNOSIS — I493 Ventricular premature depolarization: Secondary | ICD-10-CM

## 2015-04-13 DIAGNOSIS — D649 Anemia, unspecified: Secondary | ICD-10-CM | POA: Diagnosis not present

## 2015-04-13 DIAGNOSIS — R079 Chest pain, unspecified: Secondary | ICD-10-CM | POA: Diagnosis not present

## 2015-04-13 DIAGNOSIS — E669 Obesity, unspecified: Secondary | ICD-10-CM | POA: Diagnosis not present

## 2015-04-13 NOTE — Patient Instructions (Signed)
Medication Instructions:  Your physician recommends that you continue on your current medications as directed. Please refer to the Current Medication list given to you today.  Labwork: NONE  Testing/Procedures: Your physician has requested that you have an exercise tolerance test ASAP. For further information please visit [email or web address]. Please also follow instruction sheet, as given.  Follow-Up: Your physician wants you to follow-up in: 1 year with Dr. Rayann Heman. You will receive a reminder letter in the mail two months in advance. If you don't receive a letter, please call our office to schedule the follow-up appointment.    If you need a refill on your cardiac medications before your next appointment, please call your pharmacy.

## 2015-04-15 ENCOUNTER — Ambulatory Visit (HOSPITAL_COMMUNITY)
Admission: RE | Admit: 2015-04-15 | Discharge: 2015-04-15 | Disposition: A | Discharge status: Home / Self Care | Payer: BC Managed Care – PPO | Source: Ambulatory Visit | Attending: Physician Assistant | Admitting: Physician Assistant

## 2015-04-15 DIAGNOSIS — I493 Ventricular premature depolarization: Secondary | ICD-10-CM

## 2015-04-15 DIAGNOSIS — R079 Chest pain, unspecified: Secondary | ICD-10-CM | POA: Diagnosis not present

## 2015-05-03 LAB — EXERCISE TOLERANCE TEST
CHL CUP RESTING HR STRESS: 95 {beats}/min
CSEPEW: 10.4 METS
Exercise duration (min): 8 min
Exercise duration (sec): 58 s
MPHR: 184 {beats}/min
Peak HR: 173 {beats}/min
Percent HR: 94 %
RPE: 15

## 2015-09-22 DIAGNOSIS — L241 Irritant contact dermatitis due to oils and greases: Secondary | ICD-10-CM | POA: Diagnosis not present

## 2015-10-25 DIAGNOSIS — Z6832 Body mass index (BMI) 32.0-32.9, adult: Secondary | ICD-10-CM | POA: Diagnosis not present

## 2015-10-25 DIAGNOSIS — N920 Excessive and frequent menstruation with regular cycle: Secondary | ICD-10-CM | POA: Diagnosis not present

## 2015-10-25 DIAGNOSIS — Z01419 Encounter for gynecological examination (general) (routine) without abnormal findings: Secondary | ICD-10-CM | POA: Diagnosis not present

## 2015-11-11 DIAGNOSIS — J0110 Acute frontal sinusitis, unspecified: Secondary | ICD-10-CM | POA: Diagnosis not present

## 2015-11-11 DIAGNOSIS — J069 Acute upper respiratory infection, unspecified: Secondary | ICD-10-CM | POA: Diagnosis not present
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
## 2016-01-26 DIAGNOSIS — Z0289 Encounter for other administrative examinations: Secondary | ICD-10-CM | POA: Diagnosis not present

## 2016-01-26 DIAGNOSIS — R310 Gross hematuria: Secondary | ICD-10-CM | POA: Diagnosis not present

## 2016-01-26 DIAGNOSIS — D508 Other iron deficiency anemias: Secondary | ICD-10-CM | POA: Diagnosis not present

## 2016-01-26 DIAGNOSIS — Z6832 Body mass index (BMI) 32.0-32.9, adult: Secondary | ICD-10-CM | POA: Diagnosis not present

## 2016-04-09 DIAGNOSIS — Z23 Encounter for immunization: Secondary | ICD-10-CM | POA: Diagnosis not present

## 2016-06-19 DIAGNOSIS — R1030 Lower abdominal pain, unspecified: Secondary | ICD-10-CM | POA: Diagnosis not present

## 2016-06-21 DIAGNOSIS — M546 Pain in thoracic spine: Secondary | ICD-10-CM | POA: Diagnosis not present

## 2016-06-21 DIAGNOSIS — S134XXA Sprain of ligaments of cervical spine, initial encounter: Secondary | ICD-10-CM | POA: Diagnosis not present

## 2016-06-21 DIAGNOSIS — S338XXA Sprain of other parts of lumbar spine and pelvis, initial encounter: Secondary | ICD-10-CM | POA: Diagnosis not present

## 2016-06-26 DIAGNOSIS — N920 Excessive and frequent menstruation with regular cycle: Secondary | ICD-10-CM | POA: Diagnosis not present

## 2016-06-26 DIAGNOSIS — R102 Pelvic and perineal pain: Secondary | ICD-10-CM | POA: Diagnosis not present

## 2016-06-26 DIAGNOSIS — Z6833 Body mass index (BMI) 33.0-33.9, adult: Secondary | ICD-10-CM | POA: Diagnosis not present

## 2016-08-06 DIAGNOSIS — S134XXA Sprain of ligaments of cervical spine, initial encounter: Secondary | ICD-10-CM | POA: Diagnosis not present

## 2016-08-06 DIAGNOSIS — M546 Pain in thoracic spine: Secondary | ICD-10-CM | POA: Diagnosis not present

## 2016-08-06 DIAGNOSIS — S338XXA Sprain of other parts of lumbar spine and pelvis, initial encounter: Secondary | ICD-10-CM | POA: Diagnosis not present

## 2016-09-03 DIAGNOSIS — M546 Pain in thoracic spine: Secondary | ICD-10-CM | POA: Diagnosis not present

## 2016-09-03 DIAGNOSIS — S134XXA Sprain of ligaments of cervical spine, initial encounter: Secondary | ICD-10-CM | POA: Diagnosis not present

## 2016-09-03 DIAGNOSIS — S338XXA Sprain of other parts of lumbar spine and pelvis, initial encounter: Secondary | ICD-10-CM | POA: Diagnosis not present

## 2016-11-01 DIAGNOSIS — R42 Dizziness and giddiness: Secondary | ICD-10-CM | POA: Diagnosis not present

## 2016-11-01 DIAGNOSIS — R5383 Other fatigue: Secondary | ICD-10-CM | POA: Diagnosis not present

## 2016-11-01 DIAGNOSIS — K5909 Other constipation: Secondary | ICD-10-CM | POA: Diagnosis not present

## 2016-12-11 DIAGNOSIS — S338XXA Sprain of other parts of lumbar spine and pelvis, initial encounter: Secondary | ICD-10-CM | POA: Diagnosis not present

## 2016-12-11 DIAGNOSIS — S134XXA Sprain of ligaments of cervical spine, initial encounter: Secondary | ICD-10-CM | POA: Diagnosis not present

## 2016-12-11 DIAGNOSIS — M546 Pain in thoracic spine: Secondary | ICD-10-CM | POA: Diagnosis not present

## 2017-02-22 DIAGNOSIS — N951 Menopausal and female climacteric states: Secondary | ICD-10-CM | POA: Diagnosis not present

## 2017-02-25 DIAGNOSIS — F341 Dysthymic disorder: Secondary | ICD-10-CM | POA: Diagnosis not present

## 2017-02-25 DIAGNOSIS — N329 Bladder disorder, unspecified: Secondary | ICD-10-CM | POA: Diagnosis not present

## 2017-02-25 DIAGNOSIS — R454 Irritability and anger: Secondary | ICD-10-CM | POA: Diagnosis not present

## 2017-03-07 DIAGNOSIS — R1032 Left lower quadrant pain: Secondary | ICD-10-CM | POA: Diagnosis not present

## 2017-03-07 DIAGNOSIS — Z6830 Body mass index (BMI) 30.0-30.9, adult: Secondary | ICD-10-CM | POA: Diagnosis not present

## 2017-03-07 DIAGNOSIS — N920 Excessive and frequent menstruation with regular cycle: Secondary | ICD-10-CM | POA: Diagnosis not present

## 2017-07-12 DIAGNOSIS — R42 Dizziness and giddiness: Secondary | ICD-10-CM | POA: Diagnosis not present

## 2017-07-22 ENCOUNTER — Encounter: Payer: Self-pay | Admitting: *Deleted

## 2017-07-23 ENCOUNTER — Encounter: Payer: Self-pay | Admitting: Nurse Practitioner

## 2017-07-23 ENCOUNTER — Encounter (INDEPENDENT_AMBULATORY_CARE_PROVIDER_SITE_OTHER): Payer: Self-pay

## 2017-07-23 ENCOUNTER — Ambulatory Visit (INDEPENDENT_AMBULATORY_CARE_PROVIDER_SITE_OTHER): Payer: BLUE CROSS/BLUE SHIELD | Admitting: Nurse Practitioner

## 2017-07-23 VITALS — BP 118/80 | HR 73 | Ht 67.0 in | Wt 213.0 lb

## 2017-07-23 DIAGNOSIS — I493 Ventricular premature depolarization: Secondary | ICD-10-CM

## 2017-07-23 DIAGNOSIS — R42 Dizziness and giddiness: Secondary | ICD-10-CM | POA: Diagnosis not present

## 2017-07-23 LAB — BASIC METABOLIC PANEL
BUN/Creatinine Ratio: 24 — ABNORMAL HIGH (ref 9–23)
BUN: 16 mg/dL (ref 6–20)
CO2: 26 mmol/L (ref 20–29)
Calcium: 9.6 mg/dL (ref 8.7–10.2)
Chloride: 100 mmol/L (ref 96–106)
Creatinine, Ser: 0.67 mg/dL (ref 0.57–1.00)
GFR calc Af Amer: 129 mL/min/{1.73_m2} (ref >59)
GFR calc non Af Amer: 112 mL/min/{1.73_m2} (ref >59)
Glucose: 95 mg/dL (ref 65–99)
Potassium: 4.5 mmol/L (ref 3.5–5.2)
Sodium: 140 mmol/L (ref 134–144)

## 2017-07-23 LAB — MAGNESIUM: Magnesium: 1.8 mg/dL (ref 1.6–2.3)

## 2017-07-23 LAB — TSH: TSH: 2.58 u[IU]/mL (ref 0.450–4.500)

## 2017-07-23 NOTE — Patient Instructions (Addendum)
We will be checking the following labs today - BMET, TSH and Mag level   Medication Instructions:    Continue with your current medicines.     Testing/Procedures To Be Arranged:  N/A  Follow-Up:   Let's try what we talked about and then if you fail to improve - we will get you back to see Dr. Rayann Heman.     Other Special Instructions:   Please buy the "Alive Cor" - to monitor your heart rate.   Limit your caffeine  Daily V8     If you need a refill on your cardiac medications before your next appointment, please call your pharmacy.   Call the [place] office at [phone] if you have any questions, problems or concerns.
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
## 2017-07-25 ENCOUNTER — Telehealth: Payer: Self-pay | Admitting: Nurse Practitioner

## 2017-07-25 NOTE — Telephone Encounter (Signed)
New message   Pt returning call for nurse. Please call

## 2017-07-25 NOTE — Telephone Encounter (Signed)
Pt is aware of lab results and NP's recommendations. Pt verbalized understanding.

## 2017-12-30 ENCOUNTER — Encounter: Payer: Self-pay | Admitting: Internal Medicine

## 2017-12-30 DIAGNOSIS — K5900 Constipation, unspecified: Secondary | ICD-10-CM | POA: Diagnosis not present

## 2017-12-30 DIAGNOSIS — E559 Vitamin D deficiency, unspecified: Secondary | ICD-10-CM | POA: Diagnosis not present

## 2017-12-30 DIAGNOSIS — N920 Excessive and frequent menstruation with regular cycle: Secondary | ICD-10-CM | POA: Diagnosis not present

## 2017-12-30 DIAGNOSIS — R5383 Other fatigue: Secondary | ICD-10-CM | POA: Diagnosis not present

## 2017-12-30 DIAGNOSIS — E669 Obesity, unspecified: Secondary | ICD-10-CM | POA: Diagnosis not present

## 2017-12-30 DIAGNOSIS — Z0000 Encounter for general adult medical examination without abnormal findings: Secondary | ICD-10-CM | POA: Diagnosis not present

## 2018-01-17 DIAGNOSIS — Z6832 Body mass index (BMI) 32.0-32.9, adult: Secondary | ICD-10-CM | POA: Diagnosis not present

## 2018-01-17 DIAGNOSIS — Z01419 Encounter for gynecological examination (general) (routine) without abnormal findings: Secondary | ICD-10-CM | POA: Diagnosis not present

## 2018-01-17 DIAGNOSIS — N920 Excessive and frequent menstruation with regular cycle: Secondary | ICD-10-CM | POA: Diagnosis not present

## 2018-01-17 DIAGNOSIS — D219 Benign neoplasm of connective and other soft tissue, unspecified: Secondary | ICD-10-CM | POA: Diagnosis not present

## 2018-01-27 ENCOUNTER — Encounter: Payer: Self-pay | Admitting: Internal Medicine

## 2018-01-27 DIAGNOSIS — Z0389 Encounter for observation for other suspected diseases and conditions ruled out: Secondary | ICD-10-CM | POA: Diagnosis not present

## 2018-02-03 DIAGNOSIS — N920 Excessive and frequent menstruation with regular cycle: Secondary | ICD-10-CM | POA: Diagnosis not present

## 2018-02-03 DIAGNOSIS — E559 Vitamin D deficiency, unspecified: Secondary | ICD-10-CM | POA: Diagnosis not present

## 2018-02-03 DIAGNOSIS — E669 Obesity, unspecified: Secondary | ICD-10-CM | POA: Diagnosis not present

## 2018-02-03 DIAGNOSIS — R5383 Other fatigue: Secondary | ICD-10-CM | POA: Diagnosis not present

## 2018-03-05 DIAGNOSIS — R42 Dizziness and giddiness: Secondary | ICD-10-CM | POA: Diagnosis not present

## 2018-03-05 DIAGNOSIS — J302 Other seasonal allergic rhinitis: Secondary | ICD-10-CM | POA: Diagnosis not present

## 2018-03-05 DIAGNOSIS — G4489 Other headache syndrome: Secondary | ICD-10-CM | POA: Diagnosis not present

## 2018-03-05 DIAGNOSIS — E669 Obesity, unspecified: Secondary | ICD-10-CM | POA: Diagnosis not present

## 2018-03-26 ENCOUNTER — Encounter: Payer: Self-pay | Admitting: Internal Medicine

## 2018-03-26 ENCOUNTER — Ambulatory Visit (INDEPENDENT_AMBULATORY_CARE_PROVIDER_SITE_OTHER): Payer: BLUE CROSS/BLUE SHIELD | Admitting: Internal Medicine

## 2018-03-26 ENCOUNTER — Encounter

## 2018-03-26 VITALS — BP 122/80 | HR 71 | Ht 67.0 in | Wt 213.0 lb

## 2018-03-26 DIAGNOSIS — K219 Gastro-esophageal reflux disease without esophagitis: Secondary | ICD-10-CM

## 2018-03-26 DIAGNOSIS — I493 Ventricular premature depolarization: Secondary | ICD-10-CM | POA: Diagnosis not present

## 2018-03-26 DIAGNOSIS — R079 Chest pain, unspecified: Secondary | ICD-10-CM

## 2018-03-26 DIAGNOSIS — R42 Dizziness and giddiness: Secondary | ICD-10-CM

## 2018-03-26 DIAGNOSIS — R002 Palpitations: Secondary | ICD-10-CM | POA: Diagnosis not present

## 2018-03-26 DIAGNOSIS — R0600 Dyspnea, unspecified: Secondary | ICD-10-CM

## 2018-03-26 MED ORDER — PANTOPRAZOLE SODIUM 40 MG PO TBEC: 40.0000 mg | DELAYED_RELEASE_TABLET | Freq: Every day | ORAL | 1 refills | Status: DC | PRN

## 2018-03-26 NOTE — Patient Instructions (Addendum)
Medication Instructions:  Your physician has recommended you make the following change in your medication:   1.  Start taking Protonix 40 mg- Take one tablet by mouth daily as needed   Labwork: None ordered.  Testing/Procedures: Your physician has recommended that you wear an event monitor. Event monitors are medical devices that record the heart's electrical activity. Doctors most often [place] these monitors to diagnose arrhythmias. Arrhythmias are problems with the speed or rhythm of the heartbeat. The monitor is a small, portable device. You can wear one while you do your normal daily activities. This is usually used to diagnose what is causing palpitations/syncope (passing out).  Please schedule for a 30 day event monitor.  Your physician has requested that you have an exercise tolerance test. For further information please visit HugeFiesta.[email or web address]. Please also follow instruction sheet, as given.  Please schedule for an exercise treadmill stress test.  Follow-Up: Your physician wants you to follow-up in: 6 weeks with Dr. Rayann Heman.     Any Other Special Instructions Will Be Listed Below (If Applicable).  If you need a refill on your cardiac medications before your next appointment, please call your pharmacy.
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
## 2018-04-11 ENCOUNTER — Ambulatory Visit (INDEPENDENT_AMBULATORY_CARE_PROVIDER_SITE_OTHER): Payer: BLUE CROSS/BLUE SHIELD

## 2018-04-11 DIAGNOSIS — R079 Chest pain, unspecified: Secondary | ICD-10-CM | POA: Diagnosis not present

## 2018-04-11 DIAGNOSIS — R0600 Dyspnea, unspecified: Secondary | ICD-10-CM

## 2018-04-11 DIAGNOSIS — R002 Palpitations: Secondary | ICD-10-CM | POA: Diagnosis not present

## 2018-04-11 LAB — EXERCISE TOLERANCE TEST
CSEPED: 10 min
Estimated workload: 11.7 METS
Exercise duration (sec): 0 s
MPHR: 181 {beats}/min
Peak HR: 176 {beats}/min
Percent HR: 97 %
RPE: 17
Rest HR: 75 {beats}/min

## 2018-05-12 ENCOUNTER — Ambulatory Visit: Payer: BLUE CROSS/BLUE SHIELD | Admitting: Internal Medicine

## 2018-05-26 ENCOUNTER — Ambulatory Visit: Payer: BLUE CROSS/BLUE SHIELD | Admitting: Internal Medicine

## 2018-05-26 DIAGNOSIS — R0989 Other specified symptoms and signs involving the circulatory and respiratory systems: Secondary | ICD-10-CM

## 2018-05-27 ENCOUNTER — Encounter: Payer: Self-pay | Admitting: Internal Medicine

## 2018-06-12 DIAGNOSIS — J329 Chronic sinusitis, unspecified: Secondary | ICD-10-CM | POA: Diagnosis not present

## 2018-06-12 DIAGNOSIS — J209 Acute bronchitis, unspecified: Secondary | ICD-10-CM | POA: Diagnosis not present

## 2018-06-12 DIAGNOSIS — Z20828 Contact with and (suspected) exposure to other viral communicable diseases: Secondary | ICD-10-CM | POA: Diagnosis not present

## 2018-06-12 DIAGNOSIS — K5900 Constipation, unspecified: Secondary | ICD-10-CM | POA: Diagnosis not present

## 2019-11-23 DIAGNOSIS — Z1329 Encounter for screening for other suspected endocrine disorder: Secondary | ICD-10-CM | POA: Diagnosis not present

## 2019-11-23 DIAGNOSIS — Z130 Encounter for screening for diseases of the blood and blood-forming organs and certain disorders involving the immune mechanism: Secondary | ICD-10-CM | POA: Diagnosis not present

## 2019-11-23 DIAGNOSIS — Z13220 Encounter for screening for lipoid disorders: Secondary | ICD-10-CM | POA: Diagnosis not present

## 2019-11-23 DIAGNOSIS — Z862 Personal history of diseases of the blood and blood-forming organs and certain disorders involving the immune mechanism: Secondary | ICD-10-CM | POA: Diagnosis not present

## 2019-11-23 DIAGNOSIS — R350 Frequency of micturition: Secondary | ICD-10-CM | POA: Diagnosis not present

## 2019-11-23 DIAGNOSIS — Z01419 Encounter for gynecological examination (general) (routine) without abnormal findings: Secondary | ICD-10-CM | POA: Diagnosis not present

## 2019-11-23 DIAGNOSIS — Z124 Encounter for screening for malignant neoplasm of cervix: Secondary | ICD-10-CM | POA: Diagnosis not present

## 2019-11-23 DIAGNOSIS — Z6828 Body mass index (BMI) 28.0-28.9, adult: Secondary | ICD-10-CM | POA: Diagnosis not present

## 2019-12-04 DIAGNOSIS — D259 Leiomyoma of uterus, unspecified: Secondary | ICD-10-CM | POA: Diagnosis not present

## 2019-12-04 DIAGNOSIS — Z6828 Body mass index (BMI) 28.0-28.9, adult: Secondary | ICD-10-CM | POA: Diagnosis not present

## 2019-12-04 DIAGNOSIS — D509 Iron deficiency anemia, unspecified: Secondary | ICD-10-CM | POA: Diagnosis not present

## 2019-12-04 DIAGNOSIS — N920 Excessive and frequent menstruation with regular cycle: Secondary | ICD-10-CM | POA: Diagnosis not present

## 2019-12-04 DIAGNOSIS — Z30431 Encounter for routine checking of intrauterine contraceptive device: Secondary | ICD-10-CM | POA: Diagnosis not present

## 2020-02-09 DIAGNOSIS — Z6829 Body mass index (BMI) 29.0-29.9, adult: Secondary | ICD-10-CM | POA: Diagnosis not present

## 2020-02-09 DIAGNOSIS — R300 Dysuria: Secondary | ICD-10-CM | POA: Diagnosis not present

## 2020-02-18 ENCOUNTER — Other Ambulatory Visit: Payer: Self-pay

## 2020-02-18 ENCOUNTER — Telehealth: Payer: Self-pay | Admitting: Radiology

## 2020-02-18 ENCOUNTER — Encounter: Payer: Self-pay | Admitting: *Deleted

## 2020-02-18 ENCOUNTER — Ambulatory Visit (INDEPENDENT_AMBULATORY_CARE_PROVIDER_SITE_OTHER): Payer: BC Managed Care – PPO | Admitting: Physician Assistant

## 2020-02-18 VITALS — BP 124/72 | HR 71 | Ht 67.0 in | Wt 193.0 lb

## 2020-02-18 DIAGNOSIS — R002 Palpitations: Secondary | ICD-10-CM

## 2020-02-18 DIAGNOSIS — I493 Ventricular premature depolarization: Secondary | ICD-10-CM

## 2020-02-18 NOTE — Patient Instructions (Addendum)
Medication Instructions:  Your physician recommends that you continue on your current medications as directed. Please refer to the Current Medication list given to you today.  *If you need a refill on your cardiac medications before your next appointment, please call your pharmacy*   Lab Work:  BMET  CBC AND TSH TODAY   If you have labs (blood work) drawn today and your tests are completely normal, you will receive your results only by: Marland Kitchen MyChart Message (if you have MyChart) OR . A paper copy in the mail If you have any lab test that is abnormal or we need to change your treatment, we will call you to review the results.   Testing/Procedures: Your physician has requested that you have an echocardiogram. Echocardiography is a painless test that uses sound waves to create images of your heart. It provides your doctor with information about the size and shape of your heart and how well your heart's chambers and valves are working. This procedure takes approximately one hour. There are no restrictions for this procedure.   INSTRUCTIONS BELOW: Your physician has recommended that you wear an event monitor. Event monitors are medical devices that record the heart's electrical activity. Doctors most often [place] these monitors to diagnose arrhythmias. Arrhythmias are problems with the speed or rhythm of the heartbeat. The monitor is a small, portable device. You can wear one while you do your normal daily activities. This is usually used to diagnose what is causing palpitations/syncope (passing out).  Follow-Up: At [facility], you and your health needs are our priority.  As part of our continuing mission to provide you with exceptional heart care, we have created designated Provider Care Teams.  These Care Teams include your primary Cardiologist (physician) and Advanced Practice Providers (APPs -  Physician Assistants and Nurse Practitioners) who all work together to provide you with the care you need,  when you need it.  We recommend signing up for the patient portal called "MyChart".  Sign up information is provided on this After Visit Summary.  MyChart is used to connect with patients for Virtual Visits (Telemedicine).  Patients are able to view lab/test results, encounter notes, upcoming appointments, etc.  Non-urgent messages can be sent to your provider as well.   To learn more about what you can do with MyChart, go to [email or web address].    Your next appointment:   2 month(s)  The format for your next appointment:   In Person  Provider:   You may see Dr. Rayann Heman   or one of the following Advanced Practice Providers on your designated Care Team:    Chanetta Marshall, NP  Tommye Standard, [place]  Legrand Como "Oda Kilts, [place]    Other Instructions  [employer] Cardiac Event Monitor Instructions Your physician has requested you wear your cardiac event monitor for __30___ days, (1-30). [employer] may call or text to confirm a shipping address. The monitor will be sent to a land address via UPS. [employer] will not ship a monitor to a PO BOX. It typically takes 3-5 days to receive your monitor after it has been enrolled. [employer] will assist with USPS tracking if your package is delayed. The telephone number for [employer] is [phone]. Once you have received your monitor, please review the enclosed instructions. Instruction tutorials can also be viewed under help and settings on the enclosed cell phone. Your monitor has already been registered assigning a specific monitor serial # to you.  Applying the monitor Remove cell phone from case and turn  it on. The cell phone works as Dealer and needs to be within [employer] of you at all times. The cell phone will need to be charged on a daily basis. We recommend you plug the cell phone into the enclosed charger at your bedside table every night.  Monitor batteries: You will receive two monitor batteries labelled #1 and #2.  These are your recorders. Plug battery #2 onto the second connection on the enclosed charger. Keep one battery on the charger at all times. This will keep the monitor battery deactivated. It will also keep it fully charged for when you need to switch your monitor batteries. A small light will be blinking on the battery emblem when it is charging. The light on the battery emblem will remain on when the battery is fully charged.  Open package of a Monitor strip. Insert battery #1 into black hood on strip and gently squeeze monitor battery onto connection as indicated in instruction booklet. Set aside while preparing skin.  Choose location for your strip, vertical or horizontal, as indicated in the instruction booklet. Shave to remove all hair from location. There cannot be any lotions, oils, powders, or colognes on skin where monitor is to be applied. Wipe skin clean with enclosed Saline wipe. Dry skin completely.  Peel paper labeled #1 off the back of the Monitor strip exposing the adhesive. Place the monitor on the chest in the vertical or horizontal position shown in the instruction booklet. One arrow on the monitor strip must be pointing upward. Carefully remove paper labeled #2, attaching remainder of strip to your skin. Try not to create any folds or wrinkles in the strip as you apply it.  Firmly press and release the circle in the center of the monitor battery. You will hear a small beep. This is turning the monitor battery on. The heart emblem on the monitor battery will light up every 5 seconds if the monitor battery in turned on and connected to the patient securely. Do not push and hold the circle down as this turns the monitor battery off. The cell phone will locate the monitor battery. A screen will appear on the cell phone checking the connection of your monitor strip. This may read poor connection initially but change to good connection within the next minute. Once your monitor  accepts the connection you will hear a series of 3 beeps followed by a climbing crescendo of beeps. A screen will appear on the cell phone showing the two monitor strip placement options. Touch the picture that demonstrates where you applied the monitor strip.  Your monitor strip and battery are waterproof. You are able to shower, bathe, or swim with the monitor on. They just ask you do not submerge deeper than 3 feet underwater. We recommend removing the monitor if you are swimming in a lake, river, or ocean.  Your monitor battery will need to be switched to a fully charged monitor battery approximately once a week. The cell phone will alert you of an action which needs to be made.  On the cell phone, tap for details to reveal connection status, monitor battery status, and cell phone battery status. The green dots indicates your monitor is in good status. A red dot indicates there is something that needs your attention.  To record a symptom, click the circle on the monitor battery. In 30-60 seconds a list of symptoms will appear on the cell phone. Select your symptom and tap save. Your monitor will record  a sustained or significant arrhythmia regardless of you clicking the button. Some patients do not feel the heart rhythm irregularities. [employer] will notify us of any serious or critical events.  Refer to instruction booklet for instructions on switching batteries, changing strips, the Do not disturb or Pause features, or any additional questions.  Call [employer] at [phone], to confirm your monitor is transmitting and record your baseline. They will answer any questions you may have regarding the monitor instructions at that time.  Returning the monitor to [place] all equipment back into blue box. Peel off strip of paper to expose adhesive and close box securely. There is a prepaid UPS shipping label on this box. Drop in a UPS drop box, or at a UPS facility like  [employer]. You may also contact [employer] to arrange UPS to pick up monitor package at your home.

## 2020-02-18 NOTE — Telephone Encounter (Signed)
Enrolled patient for a 30 day [employer] Event monitor to be mailed to patients home.  
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
## 2020-02-19 ENCOUNTER — Telehealth: Payer: Self-pay | Admitting: Physician Assistant

## 2020-02-19 LAB — CBC
Hematocrit: 30.2 % — ABNORMAL LOW (ref 34.0–46.6)
Hemoglobin: 9.5 g/dL — ABNORMAL LOW (ref 11.1–15.9)
MCH: 25.4 pg — ABNORMAL LOW (ref 26.6–33.0)
MCHC: 31.5 g/dL (ref 31.5–35.7)
MCV: 81 fL (ref 79–97)
Platelets: 358 10*3/uL (ref 150–450)
RBC: 3.74 x10E6/uL — ABNORMAL LOW (ref 3.77–5.28)
RDW: 15.1 % (ref 11.7–15.4)
WBC: 5.1 10*3/uL (ref 3.4–10.8)

## 2020-02-19 LAB — BASIC METABOLIC PANEL
BUN/Creatinine Ratio: 12 (ref 9–23)
BUN: 12 mg/dL (ref 6–24)
CO2: 24 mmol/L (ref 20–29)
Calcium: 8.9 mg/dL (ref 8.7–10.2)
Chloride: 105 mmol/L (ref 96–106)
Creatinine, Ser: 1 mg/dL (ref 0.57–1.00)
GFR calc Af Amer: 81 mL/min/{1.73_m2} (ref >59)
GFR calc non Af Amer: 71 mL/min/{1.73_m2} (ref >59)
Glucose: 92 mg/dL (ref 65–99)
Potassium: 4.3 mmol/L (ref 3.5–5.2)
Sodium: 142 mmol/L (ref 134–144)

## 2020-02-19 LAB — TSH: TSH: 2.78 u[IU]/mL (ref 0.450–4.500)

## 2020-02-19 NOTE — Telephone Encounter (Signed)
Deborah Spence is returning [employer] call.

## 2020-02-22 ENCOUNTER — Ambulatory Visit (HOSPITAL_COMMUNITY): Payer: BC Managed Care – PPO | Attending: Cardiology

## 2020-02-22 ENCOUNTER — Other Ambulatory Visit: Payer: Self-pay

## 2020-02-22 DIAGNOSIS — R002 Palpitations: Secondary | ICD-10-CM | POA: Diagnosis present

## 2020-02-22 LAB — ECHOCARDIOGRAM COMPLETE
Area-P 1/2: 3.66 cm2
S' Lateral: 3.3 cm

## 2020-02-22 NOTE — Progress Notes (Unsigned)
   Patient ID: Deborah Spence, female    DOB: 03/27/1981, 39 y.o.   MRN: [id number]  HPI    Review of Systems    Physical Exam

## 2020-03-04 ENCOUNTER — Encounter (INDEPENDENT_AMBULATORY_CARE_PROVIDER_SITE_OTHER): Payer: BC Managed Care – PPO

## 2020-03-04 DIAGNOSIS — R002 Palpitations: Secondary | ICD-10-CM | POA: Diagnosis not present

## 2020-04-07 ENCOUNTER — Other Ambulatory Visit: Payer: Self-pay | Admitting: Physician Assistant

## 2020-04-07 DIAGNOSIS — R002 Palpitations: Secondary | ICD-10-CM

## 2020-04-09 ENCOUNTER — Other Ambulatory Visit: Payer: Self-pay

## 2020-04-09 ENCOUNTER — Inpatient Hospital Stay (HOSPITAL_COMMUNITY)
Admission: AD | Admit: 2020-04-09 | Discharge: 2020-04-09 | Disposition: A | Discharge status: Home / Self Care | Payer: BC Managed Care – PPO | Attending: Obstetrics and Gynecology | Admitting: Obstetrics and Gynecology

## 2020-04-09 DIAGNOSIS — Z86018 Personal history of other benign neoplasm: Secondary | ICD-10-CM | POA: Insufficient documentation

## 2020-04-09 DIAGNOSIS — N939 Abnormal uterine and vaginal bleeding, unspecified: Secondary | ICD-10-CM | POA: Diagnosis present

## 2020-04-09 DIAGNOSIS — Z87891 Personal history of nicotine dependence: Secondary | ICD-10-CM | POA: Insufficient documentation

## 2020-04-09 DIAGNOSIS — Z880 Allergy status to penicillin: Secondary | ICD-10-CM | POA: Diagnosis not present

## 2020-04-09 DIAGNOSIS — R109 Unspecified abdominal pain: Secondary | ICD-10-CM | POA: Diagnosis not present

## 2020-04-09 DIAGNOSIS — D259 Leiomyoma of uterus, unspecified: Secondary | ICD-10-CM | POA: Diagnosis not present

## 2020-04-09 DIAGNOSIS — D649 Anemia, unspecified: Secondary | ICD-10-CM | POA: Diagnosis not present

## 2020-04-09 LAB — POCT PREGNANCY, URINE: Preg Test, Ur: NEGATIVE

## 2020-04-09 LAB — TYPE AND SCREEN
ABO/RH(D): B POS
Antibody Screen: NEGATIVE

## 2020-04-09 LAB — CBC
HCT: 37.8 % (ref 36.0–46.0)
Hemoglobin: 12.1 g/dL (ref 12.0–15.0)
MCH: 27.6 pg (ref 26.0–34.0)
MCHC: 32 g/dL (ref 30.0–36.0)
MCV: 86.3 fL (ref 80.0–100.0)
Platelets: 284 10*3/uL (ref 150–400)
RBC: 4.38 MIL/uL (ref 3.87–5.11)
RDW: 15.4 % (ref 11.5–15.5)
WBC: 10 10*3/uL (ref 4.0–10.5)
nRBC: 0 % (ref 0.0–0.2)

## 2020-04-09 NOTE — MAU Note (Signed)
Deborah Spence is a 39 y.o. here in MAU reporting: has known large fibroids. Has been taking OCP for the past 8 weeks, has been having vaginal bleeding for 12 days. States bleeding has gotten heavier and this morning it was the worst. This AM went through 3 pads in an hour and was passing large clots. States bleeding has slowed down some, now is changing a pad and hour with quarter sized clots.   LMP: 03/26/20  Onset of complaint: ongoing  Pain score: 7/10  Vitals:   04/09/20 1232  BP: 124/89  Pulse: 88  Resp: 16  Temp: 98.7 F (37.1 C)  SpO2: 98%     Lab orders placed from triage: UPT, cbc, ts

## 2020-04-09 NOTE — Discharge Instructions (Signed)
Keep your appointment on Monday
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
## 2020-05-02 ENCOUNTER — Encounter: Payer: Self-pay | Admitting: Internal Medicine

## 2020-05-02 ENCOUNTER — Ambulatory Visit: Payer: BC Managed Care – PPO | Admitting: Internal Medicine

## 2020-05-02 ENCOUNTER — Other Ambulatory Visit: Payer: Self-pay

## 2020-05-02 VITALS — BP 112/80 | HR 70 | Ht 67.0 in | Wt 193.8 lb

## 2020-05-02 DIAGNOSIS — R002 Palpitations: Secondary | ICD-10-CM | POA: Diagnosis not present

## 2020-05-02 DIAGNOSIS — I493 Ventricular premature depolarization: Secondary | ICD-10-CM | POA: Diagnosis not present
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
## 2020-05-02 NOTE — Patient Instructions (Signed)
Medication Instructions:  Your physician recommends that you continue on your current medications as directed. Please refer to the Current Medication list given to you today.  *If you need a refill on your cardiac medications before your next appointment, please call your pharmacy*  Lab Work: None ordered.  If you have labs (blood work) drawn today and your tests are completely normal, you will receive your results only by: . MyChart Message (if you have MyChart) OR . A paper copy in the mail If you have any lab test that is abnormal or we need to change your treatment, we will call you to review the results.  Testing/Procedures: None ordered.  Follow-Up: At [facility], you and your health needs are our priority.  As part of our continuing mission to provide you with exceptional heart care, we have created designated Provider Care Teams.  These Care Teams include your primary Cardiologist (physician) and Advanced Practice Providers (APPs -  Physician Assistants and Nurse Practitioners) who all work together to provide you with the care you need, when you need it.  We recommend signing up for the patient portal called "MyChart".  Sign up information is provided on this After Visit Summary.  MyChart is used to connect with patients for Virtual Visits (Telemedicine).  Patients are able to view lab/test results, encounter notes, upcoming appointments, etc.  Non-urgent messages can be sent to your provider as well.   To learn more about what you can do with MyChart, go to [email or web address].    Your next appointment:   Your physician wants you to follow-up in: 6 months with Dr. Allred. You will receive a reminder letter in the mail two months in advance. If you don't receive a letter, please call our office to schedule the follow-up appointment.    Other Instructions:  

## 2020-05-30 ENCOUNTER — Encounter: Payer: Self-pay | Admitting: Student

## 2020-05-30 ENCOUNTER — Other Ambulatory Visit: Payer: Self-pay

## 2020-05-30 ENCOUNTER — Ambulatory Visit: Payer: BC Managed Care – PPO | Admitting: Student

## 2020-05-30 VITALS — BP 120/80 | HR 96 | Ht 67.0 in | Wt 191.0 lb

## 2020-05-30 DIAGNOSIS — R002 Palpitations: Secondary | ICD-10-CM

## 2020-05-30 DIAGNOSIS — I493 Ventricular premature depolarization: Secondary | ICD-10-CM

## 2020-05-30 LAB — BASIC METABOLIC PANEL
BUN/Creatinine Ratio: 15 (ref 9–23)
BUN: 9 mg/dL (ref 6–24)
CO2: 25 mmol/L (ref 20–29)
Calcium: 9.1 mg/dL (ref 8.7–10.2)
Chloride: 103 mmol/L (ref 96–106)
Creatinine, Ser: 0.62 mg/dL (ref 0.57–1.00)
GFR calc Af Amer: 130 mL/min/{1.73_m2} (ref >59)
GFR calc non Af Amer: 112 mL/min/{1.73_m2} (ref >59)
Glucose: 102 mg/dL — ABNORMAL HIGH (ref 65–99)
Potassium: 4.3 mmol/L (ref 3.5–5.2)
Sodium: 141 mmol/L (ref 134–144)

## 2020-05-30 LAB — CBC WITH DIFFERENTIAL/PLATELET
Basophils Absolute: 0 10*3/uL (ref 0.0–0.2)
Basos: 0 %
EOS (ABSOLUTE): 0.2 10*3/uL (ref 0.0–0.4)
Eos: 2 %
Hematocrit: 30.7 % — ABNORMAL LOW (ref 34.0–46.6)
Hemoglobin: 9.9 g/dL — ABNORMAL LOW (ref 11.1–15.9)
Immature Grans (Abs): 0 10*3/uL (ref 0.0–0.1)
Immature Granulocytes: 1 %
Lymphocytes Absolute: 1.2 10*3/uL (ref 0.7–3.1)
Lymphs: 15 %
MCH: 26.7 pg (ref 26.6–33.0)
MCHC: 32.2 g/dL (ref 31.5–35.7)
MCV: 83 fL (ref 79–97)
Monocytes Absolute: 0.5 10*3/uL (ref 0.1–0.9)
Monocytes: 6 %
Neutrophils Absolute: 6.4 10*3/uL (ref 1.4–7.0)
Neutrophils: 76 %
Platelets: 538 10*3/uL — ABNORMAL HIGH (ref 150–450)
RBC: 3.71 x10E6/uL — ABNORMAL LOW (ref 3.77–5.28)
RDW: 14.1 % (ref 11.7–15.4)
WBC: 8.4 10*3/uL (ref 3.4–10.8)
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
## 2020-05-30 NOTE — Patient Instructions (Addendum)
Medication Instructions:  *If you need a refill on your cardiac medications before your next appointment, please call your pharmacy*  Lab Work: Your physician has recommended that you have lab work today: BMET and CBC  If you have labs (blood work) drawn today and your tests are completely normal, you will receive your results only by:  MyChart Message (if you have MyChart) OR  A paper copy in the mail If you have any lab test that is abnormal or we need to change your treatment, we will call you to review the results.  Follow-Up: At [facility], you and your health needs are our priority.  As part of our continuing mission to provide you with exceptional heart care, we have created designated Provider Care Teams.  These Care Teams include your primary Cardiologist (physician) and Advanced Practice Providers (APPs -  Physician Assistants and Nurse Practitioners) who all work together to provide you with the care you need, when you need it.  We recommend signing up for the patient portal called "MyChart".  Sign up information is provided on this After Visit Summary.  MyChart is used to connect with patients for Virtual Visits (Telemedicine).  Patients are able to view lab/test results, encounter notes, upcoming appointments, etc.  Non-urgent messages can be sent to your provider as well.   To learn more about what you can do with MyChart, go to [email or web address].    Your next appointment:   Your physician recommends that you schedule a follow-up appointment in: May 2022 with Dr. Rayann Heman.  The format for your next appointment:   In Person with Thompson Grayer, MD

## 2020-06-01 ENCOUNTER — Telehealth: Payer: Self-pay

## 2020-06-01 NOTE — Telephone Encounter (Signed)
Lmtcb for results and recommendations 

## 2020-06-01 NOTE — Telephone Encounter (Signed)
-----   Message from Shirley Friar, PA-C sent at 05/31/2020  8:16 AM EST ----- BMET stable.   Per pt report, Hgb trending upward since recent surgery. Hgb 9.9 today, but still may explain some of her symptoms. Please let her know to follow up with PCP for this.   Legrand Como [address]" [place], PA-C  05/31/2020 8:16 AM

## 2020-06-01 NOTE — Telephone Encounter (Signed)
Pt returned my call Pt is aware and agreeable to lab results and recommendations Pt will follow up with surgeon and PCP in regards to low hemoglobin

## 2020-08-01 ENCOUNTER — Encounter: Payer: Self-pay | Admitting: Internal Medicine

## 2020-08-01 ENCOUNTER — Ambulatory Visit: Payer: BC Managed Care – PPO | Admitting: Internal Medicine

## 2020-08-01 ENCOUNTER — Other Ambulatory Visit: Payer: Self-pay

## 2020-08-01 VITALS — BP 116/72 | HR 78 | Ht 67.0 in | Wt 202.8 lb

## 2020-08-01 DIAGNOSIS — I493 Ventricular premature depolarization: Secondary | ICD-10-CM

## 2020-08-01 DIAGNOSIS — R002 Palpitations: Secondary | ICD-10-CM | POA: Diagnosis not present

## 2020-08-01 MED ORDER — METOPROLOL TARTRATE 25 MG PO TABS: 25.0000 mg | ORAL_TABLET | Freq: Four times a day (QID) | ORAL | 1 refills | Status: DC | PRN

## 2020-08-01 NOTE — Patient Instructions (Signed)
Medication Instructions:  Metoprolol 25 mg every 6 hours as needed for palpitations Your physician recommends that you continue on your current medications as directed. Please refer to the Current Medication list given to you today.  Labwork: None ordered.  Testing/Procedures: Your physician has requested that you have an exercise tolerance test. For further information please visit [email or web address]. Please also follow instruction sheet, as given.  Follow-Up: Your physician wants you to follow-up in: 4 months with  Legrand Como "Jonni Sanger" Chalmers Cater, PA-C  Any Other Special Instructions Will Be Listed Below (If Applicable).  If you need a refill on your cardiac medications before your next appointment, please call your pharmacy.    Exercise Stress Test An exercise stress test is a test to check how your heart works during exercise. You will need to walk on a treadmill or ride an exercise bike for this test. An electrocardiogram (ECG) will record your heartbeat when you are at rest and when you are exercising. You may have an ultrasound or nuclear test after the exercise test. The test is done to check for coronary artery disease (CAD). It is also done to:  See how well you can exercise.  Watch for high blood pressure during exercise.  Test how well you can exercise after treatment.  Check the blood flow to your arms and legs. If your test result is not normal, more testing may be needed. What happens before the procedure?  Follow instructions from your doctor about what you cannot eat or drink. ? Do not have any drinks or foods that have caffeine in them for 24 hours before the test, or as told by your doctor. This includes coffee, tea (even decaf tea), sodas, chocolate, and cocoa.  Ask your doctor about changing or stopping your normal medicines. This is important if you: ? Take diabetes medicines. ? Take beta-blocker medicines. ? Wear a nitroglycerin patch.  If you use an inhaler,  bring it with you to the test.  Do not put lotions, powders, creams, or oils on your chest before the test.  Wear comfortable shoes and clothing.  Do not use any products that have nicotine or tobacco in them, such as cigarettes and e-cigarettes. Stop using them at least 4 hours before the test. If you need help quitting, ask your doctor. What happens during the procedure?  Patches (electrodes) will be put on your chest.  Wires will be connected to the patches. The wires will send signals to a machine to record your heartbeat.  Your heart rate will be watched while you are resting and while you are exercising. Your blood pressure will also be watched during the test.  You will walk on a treadmill or use an exercise bike. If you cannot use these, you may be asked to turn a crank with your hands.  The activity will get harder and will raise your heart rate.  You may be asked to breathe into a tube a few times during the test. This measures the gases that you breathe out.  You will be asked how you are feeling throughout the test.  You will exercise until your heart reaches a target heart rate. You will stop early if: ? You feel dizzy. ? You have chest pain. ? You are out of breath. ? Your blood pressure is too high or too low. ? You have an irregular heartbeat. ? You have pain or aching in your arms or legs. The procedure may vary among doctors and hospitals.   What  happens after the procedure?  Your blood pressure, heart rate, breathing rate, and blood oxygen level will be watched after the test.  You may return to your normal diet and activities as told by your doctor.  It is up to you to get the results of your test. Ask your doctor, or the department that is doing the test, when your results will be ready. Summary  An exercise stress test is a test to check how your heart works during exercise.  This test is done to check for coronary artery disease.  Your heart rate  will be watched while you are resting and while you are exercising.  Follow instructions from your doctor about what you cannot eat or drink before the test. This information is not intended to replace advice given to you by your health care provider. Make sure you discuss any questions you have with your health care provider. Document Revised: 01/29/2020 Document Reviewed: 01/29/2020 Elsevier Patient Education  [place].
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
## 2020-08-13 ENCOUNTER — Other Ambulatory Visit (HOSPITAL_COMMUNITY): Payer: BC Managed Care – PPO

## 2020-08-15 ENCOUNTER — Other Ambulatory Visit: Payer: Self-pay | Admitting: Internal Medicine

## 2020-08-17 ENCOUNTER — Inpatient Hospital Stay (HOSPITAL_COMMUNITY): Admission: RE | Admit: 2020-08-17 | Payer: BC Managed Care – PPO | Source: Ambulatory Visit

## 2020-08-30 ENCOUNTER — Other Ambulatory Visit (HOSPITAL_COMMUNITY): Payer: BC Managed Care – PPO

## 2020-09-02 ENCOUNTER — Encounter (HOSPITAL_COMMUNITY): Payer: BC Managed Care – PPO

## 2020-09-03 NOTE — Progress Notes (Signed)
Left a voice message to the patient not to show up for her covid on Mon 3/28, as she was scheduled too early. Patient advised to show up on Tues 3/29 at 9:20am to [address] for the covid test.

## 2020-09-05 ENCOUNTER — Other Ambulatory Visit (HOSPITAL_COMMUNITY): Payer: BC Managed Care – PPO

## 2020-09-06 ENCOUNTER — Other Ambulatory Visit (HOSPITAL_COMMUNITY)
Admission: RE | Admit: 2020-09-06 | Discharge: 2020-09-06 | Disposition: A | Discharge status: Home / Self Care | Payer: BC Managed Care – PPO | Source: Ambulatory Visit | Attending: Internal Medicine | Admitting: Internal Medicine

## 2020-09-07 ENCOUNTER — Other Ambulatory Visit: Payer: Self-pay | Admitting: *Deleted

## 2020-09-08 ENCOUNTER — Telehealth (HOSPITAL_COMMUNITY): Payer: Self-pay | Admitting: Internal Medicine

## 2020-09-08 ENCOUNTER — Encounter (HOSPITAL_COMMUNITY): Payer: Self-pay

## 2020-09-08 ENCOUNTER — Telehealth (HOSPITAL_COMMUNITY): Payer: Self-pay | Admitting: *Deleted

## 2020-09-08 NOTE — Telephone Encounter (Signed)
Just an FYI. We have made several attempts to scheduling  this patient for a GXT.  We will be removing the patient from the GXT WQ due to reason below:   09/08/20 Pt NO SHOWED COVID 09/06/20 and cancelled 03/28 and 03/22.  patient has now cancelled GXT x3. [facility]W per [facility]    Thank you

## 2020-09-08 NOTE — Telephone Encounter (Signed)
Close encounter 

## 2020-09-09 ENCOUNTER — Inpatient Hospital Stay (HOSPITAL_COMMUNITY): Admission: RE | Admit: 2020-09-09 | Payer: BC Managed Care – PPO | Source: Ambulatory Visit

## 2020-09-09 ENCOUNTER — Ambulatory Visit (HOSPITAL_COMMUNITY)
Admission: RE | Admit: 2020-09-09 | Discharge: 2020-09-09 | Disposition: A | Discharge status: Home / Self Care | Payer: BC Managed Care – PPO | Source: Ambulatory Visit | Attending: Internal Medicine | Admitting: Internal Medicine

## 2020-09-09 ENCOUNTER — Other Ambulatory Visit: Payer: Self-pay

## 2020-09-09 DIAGNOSIS — I493 Ventricular premature depolarization: Secondary | ICD-10-CM | POA: Diagnosis not present

## 2020-09-09 DIAGNOSIS — R002 Palpitations: Secondary | ICD-10-CM | POA: Diagnosis present

## 2020-09-09 LAB — EXERCISE TOLERANCE TEST
Estimated workload: 11.4 METS
Exercise duration (min): 9 min
Exercise duration (sec): 55 s
MPHR: 179 {beats}/min
Peak HR: 171 {beats}/min
Percent HR: 95 %
Rest HR: 71 {beats}/min

## 2020-09-09 NOTE — Telephone Encounter (Signed)
Documentation scanned in for patients positive covid in February. No need for retesting. Stress test still on schedule for today.

## 2020-11-29 ENCOUNTER — Other Ambulatory Visit: Payer: Self-pay

## 2020-11-29 ENCOUNTER — Ambulatory Visit: Payer: BC Managed Care – PPO | Admitting: Student

## 2020-11-29 ENCOUNTER — Encounter: Payer: Self-pay | Admitting: Student

## 2020-11-29 VITALS — BP 112/80 | HR 68 | Ht 68.0 in | Wt 208.0 lb

## 2020-11-29 DIAGNOSIS — I493 Ventricular premature depolarization: Secondary | ICD-10-CM

## 2020-11-29 DIAGNOSIS — R002 Palpitations: Secondary | ICD-10-CM | POA: Diagnosis not present

## 2020-11-29 NOTE — Progress Notes (Signed)
PCP:  Hunt Oris, DO Primary Cardiologist: None Electrophysiologist: Thompson Grayer, MD   Deborah Spence is a 40 y.o. female seen today for Thompson Grayer, MD for routine electrophysiology followup.  Since last being seen in our clinic the patient reports doing very well.  She has started back [employer] and has exercised every day for the past 3 weeks. Initially, she was "wiped out", but now feels more energy and better the rest of the day after exercising. She does not palpitations during her cool down phase.   No syncope, chest pain, peripheral edema, orthopnea, or dyspnea.  She continues to have a large amount of anxiety associated with her palpitations.   Past Medical History:  Diagnosis Date   Anemia    Cervical muscle strain 01/2014   C[facility]ical[facility]zius 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     albuterol (VENTOLIN HFA) 108 (90 Base) MCG/ACT inhaler Inhale into the lungs as needed.     cimetidine (TAGAMET) 200 MG tablet Take 200 mg by mouth at bedtime.     ferrous sulfate 325 (65 FE) MG tablet Take 325 mg by mouth daily.     fluticasone (FLONASE) 50 MCG/ACT nasal spray as needed.     ibuprofen (ADVIL,MOTRIN) 100 MG tablet Take 100 mg by mouth every 6 (six) hours as needed for fever.     loratadine (CLARITIN) 10 MG tablet Take by mouth as needed.     metoprolol tartrate (LOPRESSOR) 25 MG tablet TAKE 1 TABLET(25 MG)  BY MOUTH EVERY 6 HOURS AS NEEDED FOR PALPITATIONS 30 tablet 1   simethicone (MYLICON) 80 MG chewable tablet Chew by mouth as needed.     vitamin C (ASCORBIC ACID) 500 MG tablet Take 500 mg by mouth daily.     No current facility-administered medications for this visit.    Allergies  Allergen Reactions   Penicillins Hives   Latex Other (See Comments)    Contact dermatitis    Social History   Socioeconomic History   Marital status: Married    Spouse name: Not on file   Number of children: 4   Years of education: Not on file   Highest education level: Not on file  Occupational History   Occupation: Microbiologist: OTHER    Comment: [employer] - [employer]  Tobacco Use   Smoking status: Former    Packs/day: 0.50    Years: 5.00    Pack years: 2.50    Types: Cigarettes    Quit date: 11/27/1997    Years since quitting: 23.0   Smokeless tobacco: Never   Tobacco comments:    Smoked for 5 years as teenager  Vaping Use   Vaping Use: Never used  Substance and Sexual Activity   Alcohol use: No    Alcohol/week: 0.0 standard drinks   Drug use: No   Sexual activity: Not  on file  Other Topics Concern   Not on file  Social History Narrative   Married and lives in [place].  She has 4 children.   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    She is taking classes online as well.   Social Determinants of Health   Financial Resource Strain: Not on file  Food Insecurity: Not on file  Transportation Needs: Not on file  Physical Activity: Not on file  Stress: Not on file  Social Connections: Not on file  Intimate Partner Violence: Not on file     Review of Systems: All other systems reviewed and are otherwise negative except as noted above.  Physical Exam: Vitals:   11/29/20 1045  BP: 112/80  Pulse: 68  SpO2: 98%  Weight: 208 lb (94.3 kg)  Height: 5\' 8"  (1.727 m)    GEN- The patient is well appearing, alert and oriented x 3 today.    HEENT: normocephalic, atraumatic; sclera clear, conjunctiva pink; hearing intact; oropharynx clear; neck supple, no JVP Lymph- no cervical lymphadenopathy Lungs- Clear to ausculation bilaterally, normal work of breathing.  No wheezes, rales, rhonchi Heart- Regular rate and rhythm, no murmurs, rubs or gallops, PMI not laterally displaced GI- soft, non-tender, non-distended, bowel sounds present, no hepatosplenomegaly Extremities- no clubbing, cyanosis, or edema; DP/PT/radial pulses 2+ bilaterally MS- no significant deformity or atrophy Skin- warm and dry, no rash or lesion Psych- euthymic mood, full affect Neuro- strength and sensation are intact  EKG is not ordered.   Additional studies reviewed include: Previous EP office notes  ETT 09/09/20 Exercise stress test: Clincially and electrically negative for ischemia Excellent exercise tolerance Normal GXT  Echo 02/2020 showed LVEF 60-65%   Assessment and Plan:  1. PVCs/Palpitations No arrhyhtmia on previous monitor ETT with excellent exercise tolerance and no ischemia Continue metoprolol 25 mg prn Continue to encourage exercise and trigger avoidance EKG deferred today with normal monitor 03/2020, normal EKG 07/2020 and normal ETT 09/2020  2. H/o anemia S/p hysterectomy   RTC 6 months for regular follow up while she is off school in the fall. Sooner with issues.   Shirley Friar, PA-C  11/29/20 11:02 AM

## 2020-11-29 NOTE — Patient Instructions (Signed)
Medication Instructions:  Your physician recommends that you continue on your current medications as directed. Please refer to the Current Medication list given to you today.  *If you need a refill on your cardiac medications before your next appointment, please call your pharmacy*   Lab Work: None If you have labs (blood work) drawn today and your tests are completely normal, you will receive your results only by: MyChart Message (if you have MyChart) OR A paper copy in the mail If you have any lab test that is abnormal or we need to change your treatment, we will call you to review the results.   Follow-Up: At [facility], you and your health needs are our priority.  As part of our continuing mission to provide you with exceptional heart care, we have created designated Provider Care Teams.  These Care Teams include your primary Cardiologist (physician) and Advanced Practice Providers (APPs -  Physician Assistants and Nurse Practitioners) who all work together to provide you with the care you need, when you need it.   Your next appointment:   6 month(s)  The format for your next appointment:   In Person  Provider:   You may see James Allred, MD or one of the following Advanced Practice Providers on your designated Care Team:   Renee Ursuy, PA-C Michael "Andy" Tillery, PA-C    

## 2020-12-14 ENCOUNTER — Telehealth: Payer: Self-pay | Admitting: Internal Medicine

## 2020-12-14 NOTE — Telephone Encounter (Signed)
Yes, no drug interactions noted

## 2020-12-14 NOTE — Telephone Encounter (Signed)
Left message to call back  

## 2020-12-14 NOTE — Telephone Encounter (Signed)
Pt was prescribed a Z-Pack by her PCP Dr. Loni Muse, pt wants to know if it is okay for her to start on this medicine. Please advise pt further

## 2020-12-21 NOTE — Telephone Encounter (Signed)
See my chart message

## 2021-03-15 ENCOUNTER — Ambulatory Visit: Payer: BC Managed Care – PPO | Admitting: Physician Assistant

## 2021-03-22 NOTE — Progress Notes (Signed)
PCP:  Curlene Labrum, MD Primary Cardiologist: None Electrophysiologist: Thompson Grayer, MD   Deborah Spence is a 40 y.o. female seen today for Thompson Grayer, MD for acute visit due to palpitations .  Since last being seen in our clinic the patient reports doing OK. She has had a great week, with minimal palpitations. Prior to that, she had about 6 weeks of increase palpitations. Especially when exercising really hard [employer]). Lots of GERD symptoms chronically, and recent exacerbation of both reflux and PVCs with [place] food. she denies exertional chest pain, dyspnea, PND, orthopnea, nausea, vomiting, dizziness, syncope, edema, weight gain, or early satiety.  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     albuterol (VENTOLIN HFA) 108 (90 Base) MCG/ACT inhaler Inhale into the lungs as needed.     ferrous sulfate 325 (65 FE) MG tablet Take 325 mg by mouth daily.     fluticasone (FLONASE) 50 MCG/ACT nasal spray as needed.     fluticasone-salmeterol (ADVAIR HFA) 115-21 MCG/ACT inhaler 1 puff daily.     ibuprofen (ADVIL,MOTRIN) 100 MG tablet Take 100 mg by mouth every 6 (six) hours as needed for fever.     loratadine (CLARITIN) 10 MG tablet Take by mouth as needed.     metoprolol tartrate (LOPRESSOR) 25 MG tablet TAKE 1 TABLET(25  MG) BY MOUTH EVERY 6 HOURS AS NEEDED FOR PALPITATIONS 30 tablet 1   pantoprazole (PROTONIX) 20 MG tablet Take 20 mg by mouth in the morning and at bedtime.     vitamin C (ASCORBIC ACID) 500 MG tablet Take 500 mg by mouth daily.     cimetidine (TAGAMET) 200 MG tablet Take 200 mg by mouth at bedtime. (Patient not taking: Reported on 03/23/2021)     simethicone (MYLICON) 80 MG chewable tablet Chew by mouth as needed.     No current facility-administered medications for this visit.    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  She has 4 children.   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    She is taking classes online as well.   Social Determinants of Health   Financial Resource Strain: Not on file  Food Insecurity: Not on file  Transportation Needs: Not on file  Physical Activity: Not on file  Stress: Not on file  Social Connections: Not on file  Intimate Partner Violence: Not on file     Review of Systems: All other systems reviewed and are otherwise negative except as  noted above.  Physical Exam: Vitals:   03/23/21 1102  BP: 102/78  Pulse: (!) 56  SpO2: 98%  Weight: 212 lb 6.4 oz (96.3 kg)  Height: 5\' 8"  (1.727 m)    GEN- The patient is well appearing, alert and oriented x 3 today.   HEENT: normocephalic, atraumatic; sclera clear, conjunctiva pink; hearing intact; oropharynx clear; neck supple, no JVP Lymph- no cervical lymphadenopathy Lungs- Clear to ausculation bilaterally, normal work of breathing.  No wheezes, rales, rhonchi Heart- Regular rate and rhythm, no murmurs, rubs or gallops, PMI not laterally displaced GI- soft, non-tender, non-distended, bowel sounds present, no hepatosplenomegaly Extremities- no clubbing, cyanosis, or edema; DP/PT/radial pulses 2+ bilaterally MS- no significant deformity or atrophy Skin- warm and dry, no rash or lesion Psych- euthymic mood, full affect Neuro- strength and sensation are intact  EKG is ordered. Personal review of EKG from today shows sinus bradycardia at 56 bpm, with normal intervals. 30 second rhythm strip with NO PVCs  Additional studies reviewed include: Previous EP office notes.   Assessment and Plan:  1. PVCs/Palpitations No arrhyhtmia on previous monitor ETT with excellent exercise tolerance and no ischemia She is anxious about the possibility of bradycardia. Encouraged to try metoprolol 12.5 mg prn for heavier palpitation days.  Continue to encourage exercise and trigger avoidance Reassurance given.  Recent labwork stable.    2. H/o anemia S/p hysterectomy  Per PCP  3. Anxiety This a large driving factor of her symptoms and perception of symptoms.  I encouraged her to consider speaking with her PCP re: low dose anxiolytics.   Shirley Friar, PA-C  03/23/21 11:23 AM

## 2021-03-23 ENCOUNTER — Ambulatory Visit: Payer: BC Managed Care – PPO | Admitting: Student

## 2021-03-23 ENCOUNTER — Encounter: Payer: Self-pay | Admitting: Student

## 2021-03-23 ENCOUNTER — Other Ambulatory Visit: Payer: Self-pay

## 2021-03-23 VITALS — BP 102/78 | HR 56 | Ht 68.0 in | Wt 212.4 lb

## 2021-03-23 DIAGNOSIS — I493 Ventricular premature depolarization: Secondary | ICD-10-CM | POA: Diagnosis not present

## 2021-03-23 DIAGNOSIS — R002 Palpitations: Secondary | ICD-10-CM | POA: Diagnosis not present

## 2021-03-23 NOTE — Patient Instructions (Signed)
Medication Instructions:  Your physician recommends that you continue on your current medications as directed. Please refer to the Current Medication list given to you today.  *If you need a refill on your cardiac medications before your next appointment, please call your pharmacy*   Lab Work: None If you have labs (blood work) drawn today and your tests are completely normal, you will receive your results only by: MyChart Message (if you have MyChart) OR A paper copy in the mail If you have any lab test that is abnormal or we need to change your treatment, we will call you to review the results.   Follow-Up: At [facility], you and your health needs are our priority.  As part of our continuing mission to provide you with exceptional heart care, we have created designated Provider Care Teams.  These Care Teams include your primary Cardiologist (physician) and Advanced Practice Providers (APPs -  Physician Assistants and Nurse Practitioners) who all work together to provide you with the care you need, when you need it.   Your next appointment:   6 month(s)  The format for your next appointment:   In Person  Provider:   You may see James Allred, MD or one of the following Advanced Practice Providers on your designated Care Team:   Renee Ursuy, PA-C Michael "Andy" Tillery, PA-C    

## 2022-04-09 ENCOUNTER — Encounter: Payer: Self-pay | Admitting: Internal Medicine

## 2022-04-09 ENCOUNTER — Ambulatory Visit: Payer: BC Managed Care – PPO | Admitting: Internal Medicine

## 2022-04-09 VITALS — BP 116/78 | HR 72 | Temp 98.1°F | Resp 18 | Ht 68.0 in | Wt 226.5 lb

## 2022-04-09 DIAGNOSIS — T7801XA Anaphylactic reaction due to peanuts, initial encounter: Secondary | ICD-10-CM

## 2022-04-09 DIAGNOSIS — T7805XA Anaphylactic reaction due to tree nuts and seeds, initial encounter: Secondary | ICD-10-CM | POA: Diagnosis not present

## 2022-04-09 DIAGNOSIS — J302 Other seasonal allergic rhinitis: Secondary | ICD-10-CM

## 2022-04-09 DIAGNOSIS — J4540 Moderate persistent asthma, uncomplicated: Secondary | ICD-10-CM | POA: Diagnosis not present

## 2022-04-09 DIAGNOSIS — H1013 Acute atopic conjunctivitis, bilateral: Secondary | ICD-10-CM

## 2022-04-09 DIAGNOSIS — J3089 Other allergic rhinitis: Secondary | ICD-10-CM | POA: Diagnosis not present

## 2022-04-09 MED ORDER — MONTELUKAST SODIUM 10 MG PO TABS: 10.0000 mg | ORAL_TABLET | Freq: Every day | ORAL | 5 refills | Status: DC

## 2022-04-09 MED ORDER — ALBUTEROL SULFATE HFA 108 (90 BASE) MCG/ACT IN AERS: INHALATION_SPRAY | RESPIRATORY_TRACT | 1 refills | Status: DC

## 2022-04-09 MED ORDER — FEXOFENADINE HCL 180 MG PO TABS: 180.0000 mg | ORAL_TABLET | Freq: Every day | ORAL | 5 refills | Status: AC

## 2022-04-09 MED ORDER — FLUTICASONE-SALMETEROL 115-21 MCG/ACT IN AERO: 2.0000 | INHALATION_SPRAY | Freq: Two times a day (BID) | RESPIRATORY_TRACT | 5 refills | Status: AC

## 2022-04-09 MED ORDER — AZELASTINE HCL 0.1 % NA SOLN: NASAL | 5 refills | Status: AC

## 2022-04-09 MED ORDER — MOMETASONE FUROATE 50 MCG/ACT NA SUSP: 2.0000 | Freq: Every day | NASAL | 5 refills | Status: AC

## 2022-04-09 NOTE — Progress Notes (Signed)
NEW PATIENT  Date of Service/Encounter:  04/09/22  Consult requested by: Curlene Labrum, MD   Subjective:   Deborah Spence (DOB: 07-14-1980) is a 41 y.o. female who presents to the clinic on 04/09/2022 with a chief complaint of Allergic Rhinitis  (Cats/dogs bother her. Swelling of eyes, sneezing, sinus pressure with cats. Dogs cause her to wheeze. Allergies get worse she gets to [place] ), Asthma (Trouble with asthma since 2021. Wheezes 2 weeks out of every month. Wheeze,chest tightness,lots of mucus. She is currently using advair as needed for about a week and then she will stop ), Allergic Reaction (Peanut, almond- throat feels tight, cough/mucus ), Gastroesophageal Reflux (Severe Acid Reflux. ), Other (Anytime she eats she has lots of mucus. ), Medication Refill (Albuterol ), and Frequent Infections (Sick a lot for the past 2 years ) .    History obtained from: chart review and patient.   Asthma:  Diagnosed in childhood and it resolved and then it recurred in 2021 after having COVID.  She works in [employer] [place] and it does worsen when she is there.  Her symptoms include coughing, trouble breathing and chest tightness.  She is a respiratory therapist.  Almost 10-14 days of daytime symptoms in past month, 0 nighttime awakenings in past month Using rescue inhaler about 2-3x/week Limitations to daily activity: mild 0 ED visits, 0 UC visits and 3 oral steroids in the past year 0 number of lifetime hospitalizations, 0 number of lifetime intubations.  Identified Triggers:  dogs, exercise, and respiratory illness Prior PFTs or spirometry: none Current regimen:  Maintenance: Singulair '10mg'$  daily, Advair 115-21 PRN. Last use was last night and about 2 weeks prior to that.  Rescue: Albuterol 2 puffs q4-6 hrs PRN   Rhinitis:  Started in childhood.  It has worsened after COVID in 2021.  Symptoms include: nasal congestion, rhinorrhea, post nasal drainage, sneezing, watery eyes, and  itchy eyes  Occurs year-round Potential triggers: cats, dogs  Treatments tried: Claritin PRN, last use was last week. Has tried Flonase previously but had sore throat with it.  Previous allergy testing: no History of reflux/heartburn: yes she has a lot of trouble with this and is on Protonix '20mg'$  daily. It is controlled with the Protonix but worsens when she comes off of it.  History of chronic sinusitis or sinus surgery: no She is interested in AIT  Concern for Food Allergy:  Foods of concern: peanut, almond  History of reaction:  Peanut- throat tightness and coughing in about 30 minutes.  She drank lots of water and it eased off a few hours after.   Almond/cashew- throat tightness in about 30 minutes.  Avocado/cantaloupe- severe stomach burning  Now avoids all nuts, avocado, cantaloupe  Previous allergy testing no Carries an epinephrine autoinjector: no    Past Medical History: Past Medical History:  Diagnosis Date   Anemia    Asthma    Cervical muscle strain 01/09/2014   Cervical/Trapezius strain from [facility]   Fibroids    Causing anemia   GERD (gastroesophageal reflux disease)    Obesity    PVC's (premature ventricular contractions)    Sinus tachycardia     Past Surgical History: Past Surgical History:  Procedure Laterality Date   DILATION AND CURETTAGE, DIAGNOSTIC / THERAPEUTIC  1994, 1996   TRANSTHORACIC ECHOCARDIOGRAM  06/2012   EF 55-60%, small atrial septal aneurysm (within right atrial cavity) - ordered for dyspnea    Family History: Family History  Problem Relation Age of  Onset   Asthma Mother    Atrial fibrillation Mother        SSS, AF ablation   Arrhythmia Mother        A-FIB   Diabetes Father    Arrhythmia Maternal Grandmother        A-FIB   Lung cancer Maternal Grandfather    Cancer Paternal Grandmother    Heart attack Neg Hx    Hypertension Neg Hx    Stroke Neg Hx     Social History:  Lives in a 30 year house Flooring in bedroom:  carpet Pets: none Tobacco use/exposure: none Job: RT  Medication List:  Allergies as of 04/09/2022       Reactions   Penicillins Hives   Latex Other (See Comments)   Contact dermatitis        Medication List        Accurate as of April 09, 2022 12:42 PM. If you have any questions, ask your nurse or doctor.          acetaminophen 500 MG tablet Commonly known as: TYLENOL Take 500 mg by mouth every 6 (six) hours as needed for mild pain or moderate pain.   Advair HFA 115-21 MCG/ACT inhaler Generic drug: fluticasone-salmeterol 1 puff daily.   albuterol 108 (90 Base) MCG/ACT inhaler Commonly known as: VENTOLIN HFA Inhale into the lungs as needed.   ascorbic acid 500 MG tablet Commonly known as: VITAMIN C Take 500 mg by mouth daily.   calcium carbonate 500 MG chewable tablet Commonly known as: TUMS - dosed in mg elemental calcium Chew 1 tablet by mouth daily.   cimetidine 200 MG tablet Commonly known as: TAGAMET Take 200 mg by mouth at bedtime.   ferrous sulfate 325 (65 FE) MG tablet Take 325 mg by mouth daily.   fluticasone 50 MCG/ACT nasal spray Commonly known as: FLONASE as needed.   ibuprofen 100 MG tablet Commonly known as: ADVIL Take 100 mg by mouth every 6 (six) hours as needed for fever.   loratadine 10 MG tablet Commonly known as: CLARITIN Take by mouth as needed.   metoprolol tartrate 25 MG tablet Commonly known as: LOPRESSOR TAKE 1 TABLET(25 MG) BY MOUTH EVERY 6 HOURS AS NEEDED FOR PALPITATIONS   montelukast 10 MG tablet Commonly known as: SINGULAIR Take 10 mg by mouth daily.   pantoprazole 20 MG tablet Commonly known as: PROTONIX Take 20 mg by mouth in the morning and at bedtime.   simethicone 80 MG chewable tablet Commonly known as: MYLICON Chew by mouth as needed.         REVIEW OF SYSTEMS: Pertinent positives and negatives discussed in HPI.   Objective:   Physical Exam: BP 116/78   Pulse 72   Temp 98.1 F (36.7 C)    Resp 18   Ht '5\' 8"'$  (1.727 m)   Wt 226 lb 8 oz (102.7 kg)   SpO2 96%   BMI 34.44 kg/m  Body mass index is 34.44 kg/m. GEN: alert, well developed HEENT: clear conjunctiva, TM grey and translucent, nose with + inferior turbinate hypertrophy, pale nasal mucosa, slight clear rhinorrhea, + cobblestoning HEART: regular rate and rhythm, no murmur LUNGS: clear to auscultation bilaterally, no coughing, unlabored respiration ABDOMEN: soft, non distended  SKIN: no rashes or lesions  Reviewed:  No records  Spirometry:  Tracings reviewed. Her effort: Good reproducible efforts. FVC: 4.04L, post 4.14 FEV1: 3.50L, 104% predicted; post 3.54, 106% FEV1/FVC ratio: 87%; 86% Interpretation: Spirometry consistent with normal pattern.  Please see scanned spirometry results for details.  Skin Testing:  Skin prick testing was placed, which includes aeroallergens/foods, histamine control, and saline control.  Verbal consent was obtained prior to placing test.  We discussed risks including anaphylaxis. Patient tolerated procedure well.  Allergy testing results were read and interpreted by myself, documented by clinical staff. Adequate positive and negative control.  Results discussed with patient/family.  Airborne Adult Perc - 04/09/22 0951     Time Antigen Placed 5277    Allergen Manufacturer Lavella Hammock    Location Back    Number of Test 59    1. Control-Buffer 50% Glycerol Negative    2. Control-Histamine 1 mg/ml 3+    3. Albumin saline Negative    4. [place] 2+    5. [place] 2+    6. Johnson Negative    7. [place] Blue 2+    8. Meadow Fescue 3+    9. Perennial Rye 3+    10. Sweet Vernal 3+    11. Timothy 3+    12. Cocklebur 2+    13. Burweed Marshelder Negative    14. Ragweed, short 3+    15. Ragweed, Giant Negative    16. Plantain,  English 3+    17. Lamb's Quarters 2+    18. Sheep Sorrell Negative    19. Rough Pigweed Negative    20. Marsh Elder, Rough Negative    21. Mugwort, Common  Negative    22. Ash mix Negative    23. Birch mix Negative    24. Beech American Negative    25. Box, Elder Negative    26. Cedar, red Negative    27. Cottonwood, [place] Negative    28. Elm mix Negative    29. Hickory Negative    30. Maple mix Negative    31. Oak, [place] mix Negative    32. Pecan Pollen Negative    33. Pine mix Negative    34. Sycamore Eastern Negative    35. Rolla, Black Pollen Negative    36. Alternaria alternata Negative    37. Cladosporium Herbarum Negative    38. Aspergillus mix Negative    39. Penicillium mix Negative    40. Bipolaris sorokiniana (Helminthosporium) Negative    41. Drechslera spicifera (Curvularia) Negative    42. Mucor plumbeus Negative    43. Fusarium moniliforme Negative    44. Aureobasidium pullulans (pullulara) Negative    45. Rhizopus oryzae Negative    46. Botrytis cinera Negative    47. Epicoccum nigrum Negative    48. Phoma betae Negative    49. Candida Albicans Negative    50. Trichophyton mentagrophytes Negative    51. Mite, D Farinae  5,000 AU/ml 4+    52. Mite, D Pteronyssinus  5,000 AU/ml 4+    53. Cat Hair 10,000 BAU/ml 3+    54.  Dog Epithelia Negative    55. Mixed Feathers Negative    56. Horse Epithelia Negative    57. Cockroach, German Negative    58. Mouse Negative    59. Tobacco Leaf Negative             Intradermal - 04/09/22 1113     Time Antigen Placed 1050    Allergen Manufacturer Greer    Location Arm    Number of Test 9    Control Negative    Johnson 2+    Tree mix Negative    Mold 1 3+    Mold 2 3+  Mold 3 3+    Mold 4 3+    Dog 2+    Cockroach Negative             Food Adult Perc - 04/09/22 0900     Time Antigen Placed 4268    Allergen Manufacturer Lavella Hammock    Location Back    Number of allergen test 12     Control-buffer 50% Glycerol Negative    Control-Histamine 1 mg/ml 3+    1. Peanut Negative    10. Cashew Negative    11. Pecan Food Negative    12. [place]  Negative    13. Almond Negative    14. Hazelnut Negative    15. [place] nut Negative    17. Pistachio Negative    48. Avocado Negative    61. Cantaloupe Negative               Assessment:   1. Moderate persistent asthma without complication   2. Seasonal and perennial allergic rhinitis   3. Allergic conjunctivitis of both eyes   4. Anaphylaxis due to tree nut, initial encounter   5. Peanut-induced anaphylaxis, initial encounter     Plan/Recommendations:  Allergic Rhinitis Allergic Conjunctivitis - Positive skin test 03/2022 to grasses, weeds, dust mite, mold, dog, cat - Avoidance measures discussed. - Use nasal saline rinses before nose sprays such as with Neilmed Sinus Rinse.  Use distilled water.   - Use Nasacort or Nasonex 2 sprays each nostril daily. Aim upward and outward. - Use Azelastine 1-2 sprays each nostril twice daily. Aim upward and outward. - Use Allegra '180mg'$  daily instead of Claritin.   - Use Singulair '10mg'$  daily.  Black box warning discussed.  Stop if there are any mood/behavioral changes. - Consider allergy shots as long term control of your symptoms by teaching your immune system to be more tolerant of your allergy triggers  Moderate Persistent Asthma: - MDI technique discussed.   - Maintenance inhaler: use Advair 115-101mg  2 puffs twice daily. - Rescue inhaler: Albuterol 2 puffs via spacer or 1 vial via nebulizer every 4-6 hours as needed for respiratory symptoms of cough, shortness of breath, or wheezing Asthma control goals:  Full participation in all desired activities (may need albuterol before activity) Albuterol use two times or less a week on average (not counting use with activity) Cough interfering with sleep two times or less a month Oral steroids no more than once a year No hospitalizations  Food Allergy - For now, avoid peanuts and treenut.  Unclear if this is a food allergy.   - SPT to nuts was negative 03/2022.  Will obtain blood  testing to nuts at next visit. If both are negative, consider in office challenge. - SPT to avocado/cantaloupe negative.  Reaction to avocado/cantaloupe are not consistent with IgE mediated reaction, likely a food intolerance.  Can try reintroduction if symptoms are tolerable or continue avoidance.    Return in about 4 weeks (around 05/07/2022).  PHarlon Flor MD [facility] [place]

## 2022-04-09 NOTE — Patient Instructions (Addendum)
Return in about 4 weeks (around 05/07/2022).    Rhinitis: - Positive skin test to: grasses, weeds, dust mite, mold, dog, cat - Avoidance measures discussed. - Use nasal saline rinses before nose sprays such as with Neilmed Sinus Rinse.  Use distilled water.   - Use Nasacort or Nasonex 2 sprays each nostril daily. Aim upward and outward. - Use Azelastine 1-2 sprays each nostril twice daily. Aim upward and outward. - Use Allegra '180mg'$  daily instead of Claritin.   - Use Singulair '10mg'$  daily.  Black box warning discussed.  Stop if there are any mood/behavioral changes. - Consider allergy shots as long term control of your symptoms by teaching your immune system to be more tolerant of your allergy triggers  Asthma: - MDI technique discussed.   - Maintenance inhaler: use Advair 115-20mg  2 puffs twice daily. - Rescue inhaler: Albuterol 2 puffs via spacer or 1 vial via nebulizer every 4-6 hours as needed for respiratory symptoms of cough, shortness of breath, or wheezing Asthma control goals:  Full participation in all desired activities (may need albuterol before activity) Albuterol use two times or less a week on average (not counting use with activity) Cough interfering with sleep two times or less a month Oral steroids no more than once a year No hospitalizations   ALLERGEN AVOIDANCE MEASURES  Dust Mites Use central air conditioning and heat; and change the filter monthly.  Pleated filters work better than mesh filters.  Electrostatic filters may also be used; wash the filter monthly.  Window air conditioners may be used, but do not clean the air as well as a central air conditioner.  Change or wash the filter monthly. Keep windows closed.  Do not use attic fans.   Encase the mattress, box springs and pillows with zippered, dust proof covers. Wash the bed linens in hot water weekly.   Remove carpet, especially from the bedroom. Remove stuffed animals, throw pillows, dust ruffles,  heavy drapes and other items that collect dust from the bedroom. Do not use a humidifier.   Use wood, vinyl or leather furniture instead of cloth furniture in the bedroom. Keep the indoor humidity at 30 - 40%.  Monitor with a humidity gauge.  Molds - Indoor avoidance Use air conditioning to reduce indoor humidity.  Do not use a humidifier. Keep indoor humidity at 30 - 40%.  Use a dehumidifier if needed. In the bathroom use an exhaust fan or open a window after showering.  Wipe down damp surfaces after showering.  Clean bathrooms with a mold-killing solution (diluted bleach, or products like Tilex, etc) at least once a month. In the kitchen use an exhaust fan to remove steam from cooking.  Throw away spoiled foods immediately, and empty garbage daily.  Empty water pans below self-defrosting refrigerators frequently. Vent the clothes dryer to the outside. Limit indoor houseplants; mold grows in the dirt.  No houseplants in the bedroom. Remove carpet from the bedroom. Encase the mattress and box springs with a zippered encasing.  Molds - Outdoor avoidance Avoid being outside when the grass is being mowed, or the ground is tilled. Avoid playing in leaves, pine straw, hay, etc.  Dead plant materials contain mold. Avoid going into barns or grain storage areas. Remove leaves, clippings and compost from around the home.  Pollen Avoidance Pollen levels are highest during the mid-day and afternoon.  Consider this when planning outdoor activities. Avoid being outside when the grass is being mowed, or wear a mask if the  pollen-allergic person must be the one to mow the grass. Keep the windows closed to keep pollen outside of the home. Use an air conditioner to filter the air. Take a shower, wash hair, and change clothing after working or playing outdoors during pollen season.

## 2022-04-09 NOTE — Addendum Note (Signed)
Addended by: Norville Haggard on: 04/09/2022 04:01 PM   Modules accepted: Orders

## 2022-05-07 ENCOUNTER — Ambulatory Visit: Payer: BC Managed Care – PPO | Admitting: Internal Medicine

## 2022-05-15 ENCOUNTER — Other Ambulatory Visit: Payer: Self-pay | Admitting: Internal Medicine

## 2022-12-16 ENCOUNTER — Other Ambulatory Visit: Payer: Self-pay | Admitting: Internal Medicine

## 2024-01-09 ENCOUNTER — Other Ambulatory Visit (HOSPITAL_COMMUNITY): Payer: Self-pay | Admitting: Family Medicine

## 2024-01-09 DIAGNOSIS — Z1231 Encounter for screening mammogram for malignant neoplasm of breast: Secondary | ICD-10-CM
